# Patient Record
Sex: Male | Born: 1985 | Race: White | Hispanic: No | Marital: Single | Smoking: Current every day smoker
Health system: Southern US, Community
[De-identification: ages and names within clinical notes are randomized; demographics above are authoritative.]

## PROBLEM LIST (undated history)

## (undated) HISTORY — PX: CLAVICLE SURGERY: SHX598

---
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
## 2004-07-20 ENCOUNTER — Emergency Department (HOSPITAL_COMMUNITY): Admission: EM | Admit: 2004-07-20 | Discharge: 2004-07-20 | Payer: Self-pay | Admitting: Emergency Medicine

## 2008-12-15 ENCOUNTER — Emergency Department (HOSPITAL_BASED_OUTPATIENT_CLINIC_OR_DEPARTMENT_OTHER): Admission: EM | Admit: 2008-12-15 | Discharge: 2008-12-15 | Payer: Self-pay | Admitting: Emergency Medicine

## 2009-09-30 ENCOUNTER — Emergency Department (HOSPITAL_BASED_OUTPATIENT_CLINIC_OR_DEPARTMENT_OTHER): Admission: EM | Admit: 2009-09-30 | Discharge: 2009-10-01 | Payer: Self-pay | Admitting: Emergency Medicine

## 2009-10-01 ENCOUNTER — Ambulatory Visit: Payer: Self-pay | Admitting: Diagnostic Radiology

## 2010-04-08 ENCOUNTER — Emergency Department (HOSPITAL_COMMUNITY): Admission: EM | Admit: 2010-04-08 | Discharge: 2010-04-08 | Payer: Self-pay | Admitting: Emergency Medicine

## 2019-05-01 ENCOUNTER — Other Ambulatory Visit: Payer: Self-pay

## 2019-05-01 DIAGNOSIS — Z20822 Contact with and (suspected) exposure to covid-19: Secondary | ICD-10-CM

## 2019-05-03 LAB — NOVEL CORONAVIRUS, NAA: SARS-CoV-2, NAA: NOT DETECTED

## 2020-01-29 ENCOUNTER — Encounter (HOSPITAL_COMMUNITY): Payer: Self-pay | Admitting: *Deleted

## 2020-01-29 ENCOUNTER — Emergency Department (HOSPITAL_COMMUNITY): Payer: HRSA Program

## 2020-01-29 ENCOUNTER — Other Ambulatory Visit: Payer: Self-pay

## 2020-01-29 ENCOUNTER — Emergency Department (HOSPITAL_COMMUNITY)
Admission: EM | Admit: 2020-01-29 | Discharge: 2020-01-29 | Disposition: A | Discharge status: Home / Self Care | Payer: HRSA Program | Attending: Emergency Medicine | Admitting: Emergency Medicine

## 2020-01-29 DIAGNOSIS — U071 COVID-19: Secondary | ICD-10-CM

## 2020-01-29 DIAGNOSIS — Z87891 Personal history of nicotine dependence: Secondary | ICD-10-CM | POA: Diagnosis not present

## 2020-01-29 DIAGNOSIS — R05 Cough: Secondary | ICD-10-CM | POA: Diagnosis present

## 2020-01-29 LAB — SARS CORONAVIRUS 2 BY RT PCR (HOSPITAL ORDER, PERFORMED IN ~~LOC~~ HOSPITAL LAB): SARS Coronavirus 2: POSITIVE — AB

## 2020-01-29 MED ORDER — ALBUTEROL SULFATE HFA 108 (90 BASE) MCG/ACT IN AERS
2.0000 | INHALATION_SPRAY | Freq: Once | RESPIRATORY_TRACT | Status: AC
  Administered 2020-01-29: 2 via RESPIRATORY_TRACT
  Filled 2020-01-29: qty 6.7

## 2020-01-29 MED ORDER — ACETAMINOPHEN 500 MG PO TABS
1000.0000 mg | ORAL_TABLET | Freq: Once | ORAL | Status: AC
  Administered 2020-01-29: 1000 mg via ORAL
  Filled 2020-01-29: qty 2

## 2020-01-29 NOTE — Discharge Instructions (Signed)
You have tested positive for COVID-19 virus.  Please continue to quarantine at home and monitor your symptoms closely. You chest x-ray showed some signs of early Covid pneumonia. Antibiotics are not helpful in treating viral infection, the virus should run its course in about 10-14 days. Please make sure you are drinking plenty of fluids. You can treat your symptoms supportively with tylenol at 1000 mg and ibuprofen 600 mg every 6 hours for fevers and pains, and over the counter cough syrups and throat lozenges to help with cough.  You can use albuterol inhaler to help with shortness of breath and cough.  If your symptoms are not improving please follow up with you Primary doctor.   I recommend that you purchase a home pulse ox to help better monitor your oxygen at home, if you start to have increased work of breathing or shortness of breath or your oxygen drops below 92% please immediately return to the hospital for reevaluation.  If you develop persistent fevers, shortness of breath or difficulty breathing, chest pain, severe headache and neck pain, persistent nausea and vomiting or other new or concerning symptoms return to the Emergency department.

## 2020-01-29 NOTE — ED Notes (Signed)
pulse ox while ambulating p 107  Pulse ox 97 per cent RA
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
## 2020-01-29 NOTE — ED Triage Notes (Signed)
Pt c/o generalized body aches and weakness; pt states he has green drainage in his nose; pt c/o sob

## 2020-01-30 ENCOUNTER — Other Ambulatory Visit (HOSPITAL_COMMUNITY): Payer: Self-pay | Admitting: Nurse Practitioner

## 2020-01-30 ENCOUNTER — Telehealth (HOSPITAL_COMMUNITY): Payer: Self-pay | Admitting: Nurse Practitioner

## 2020-01-30 DIAGNOSIS — U071 COVID-19: Secondary | ICD-10-CM

## 2020-01-30 DIAGNOSIS — Z6830 Body mass index (BMI) 30.0-30.9, adult: Secondary | ICD-10-CM

## 2020-01-30 NOTE — Telephone Encounter (Signed)
Called patient to review monoclonal antibody treatment for covid 19 given to patients who are at risk for complications and/or hospitalization from the virus.   Patient meets at risk criteria due to: symptomatic, BMI>25.  After discussion, the patient wished to proceed with therapy and was scheduled for 9/7 at 0830am.  Possible side effects and infusion related reaction information was discussed. Further information sent to patient via MyChart.   Ocie Bob, AGNP-C

## 2020-01-30 NOTE — Progress Notes (Signed)
I connected by phone with Sean Soto on 01/30/2020 at 6:22 PM to discuss the potential use of a new treatment for mild to moderate COVID-19 viral infection in non-hospitalized patients.  This patient is a 34 y.o. male that meets the FDA criteria for Emergency Use Authorization of COVID monoclonal antibody casirivimab/imdevimab.  Has a (+) direct SARS-CoV-2 viral test result  Has mild or moderate COVID-19   Is NOT hospitalized due to COVID-19  Is within 10 days of symptom onset  Has at least one of the high risk factor(s) for progression to severe COVID-19 and/or hospitalization as defined in EUA.  Specific high risk criteria : BMI > 25   I have spoken and communicated the following to the patient or parent/caregiver regarding COVID monoclonal antibody treatment:  1. FDA has authorized the emergency use for the treatment of mild to moderate COVID-19 in adults and pediatric patients with positive results of direct SARS-CoV-2 viral testing who are 34 years of age and older weighing at least 40 kg, and who are at high risk for progressing to severe COVID-19 and/or hospitalization.  2. The significant known and potential risks and benefits of COVID monoclonal antibody, and the extent to which such potential risks and benefits are unknown.  3. Information on available alternative treatments and the risks and benefits of those alternatives, including clinical trials.  4. Patients treated with COVID monoclonal antibody should continue to self-isolate and use infection control measures (e.g., wear mask, isolate, social distance, avoid sharing personal items, clean and disinfect "high touch" surfaces, and frequent handwashing) according to CDC guidelines.   5. The patient or parent/caregiver has the option to accept or refuse COVID monoclonal antibody treatment.  After reviewing this information with the patient, The patient agreed to proceed with receiving casirivimab\imdevimab infusion and  will be provided a copy of the Fact sheet prior to receiving the infusion. Sean Soto 01/30/2020 6:22 PM

## 2020-01-31 ENCOUNTER — Ambulatory Visit (HOSPITAL_COMMUNITY)
Admission: RE | Admit: 2020-01-31 | Discharge: 2020-01-31 | Disposition: A | Discharge status: Home / Self Care | Payer: HRSA Program | Source: Ambulatory Visit | Attending: Pulmonary Disease | Admitting: Pulmonary Disease

## 2020-01-31 DIAGNOSIS — U071 COVID-19: Secondary | ICD-10-CM | POA: Diagnosis not present

## 2020-01-31 DIAGNOSIS — Z6830 Body mass index (BMI) 30.0-30.9, adult: Secondary | ICD-10-CM

## 2020-01-31 MED ORDER — ALBUTEROL SULFATE HFA 108 (90 BASE) MCG/ACT IN AERS: 2.0000 | INHALATION_SPRAY | Freq: Once | RESPIRATORY_TRACT | Status: DC | PRN

## 2020-01-31 MED ORDER — FAMOTIDINE IN NACL 20-0.9 MG/50ML-% IV SOLN: 20.0000 mg | Freq: Once | INTRAVENOUS | Status: DC | PRN

## 2020-01-31 MED ORDER — SODIUM CHLORIDE 0.9 % IV SOLN
INTRAVENOUS | Status: DC | PRN
  Administered 2020-01-31: 1000 mL via INTRAVENOUS

## 2020-01-31 MED ORDER — EPINEPHRINE 0.3 MG/0.3ML IJ SOAJ: 0.3000 mg | Freq: Once | INTRAMUSCULAR | Status: DC | PRN

## 2020-01-31 MED ORDER — SODIUM CHLORIDE 0.9 % IV SOLN
1200.0000 mg | Freq: Once | INTRAVENOUS | Status: AC
  Administered 2020-01-31: 1200 mg via INTRAVENOUS
  Filled 2020-01-31: qty 10

## 2020-01-31 MED ORDER — DIPHENHYDRAMINE HCL 50 MG/ML IJ SOLN: 50.0000 mg | Freq: Once | INTRAMUSCULAR | Status: DC | PRN

## 2020-01-31 MED ORDER — METHYLPREDNISOLONE SODIUM SUCC 125 MG IJ SOLR: 125.0000 mg | Freq: Once | INTRAMUSCULAR | Status: DC | PRN

## 2020-01-31 NOTE — Discharge Instructions (Signed)

## 2020-01-31 NOTE — Progress Notes (Signed)
  Diagnosis: COVID-19  Physician: Dr Wright  Procedure: Cov[facility] Infusion Clinic Med: casirivimab\imdevimab infusion - Provided patient with casirivimab\imdevimab fact sheet for patients, parents and caregivers prior to infusion.  Complications: No immediate complications noted.  Discharge: Discharged home   Sean Soto Jane 01/31/2020   

## 2021-03-15 ENCOUNTER — Encounter (HOSPITAL_COMMUNITY): Payer: Self-pay | Admitting: Surgery

## 2021-03-15 ENCOUNTER — Emergency Department (HOSPITAL_COMMUNITY): Payer: Self-pay

## 2021-03-15 ENCOUNTER — Inpatient Hospital Stay (HOSPITAL_COMMUNITY)
Admission: EM | Admit: 2021-03-15 | Discharge: 2021-03-21 | DRG: 459 | Disposition: A | Discharge status: Home / Self Care | Payer: Self-pay | Attending: Surgery | Admitting: Surgery

## 2021-03-15 DIAGNOSIS — S22051A Stable burst fracture of T5-T6 vertebra, initial encounter for closed fracture: Secondary | ICD-10-CM | POA: Diagnosis present

## 2021-03-15 DIAGNOSIS — J939 Pneumothorax, unspecified: Secondary | ICD-10-CM

## 2021-03-15 DIAGNOSIS — S270XXA Traumatic pneumothorax, initial encounter: Secondary | ICD-10-CM | POA: Diagnosis present

## 2021-03-15 DIAGNOSIS — S22009A Unspecified fracture of unspecified thoracic vertebra, initial encounter for closed fracture: Secondary | ICD-10-CM | POA: Diagnosis present

## 2021-03-15 DIAGNOSIS — T1490XA Injury, unspecified, initial encounter: Secondary | ICD-10-CM

## 2021-03-15 DIAGNOSIS — S22081A Stable burst fracture of T11-T12 vertebra, initial encounter for closed fracture: Principal | ICD-10-CM | POA: Diagnosis present

## 2021-03-15 DIAGNOSIS — S22001A Stable burst fracture of unspecified thoracic vertebra, initial encounter for closed fracture: Secondary | ICD-10-CM

## 2021-03-15 DIAGNOSIS — Y92410 Unspecified street and highway as the place of occurrence of the external cause: Secondary | ICD-10-CM

## 2021-03-15 DIAGNOSIS — R52 Pain, unspecified: Secondary | ICD-10-CM

## 2021-03-15 DIAGNOSIS — G911 Obstructive hydrocephalus: Secondary | ICD-10-CM | POA: Diagnosis present

## 2021-03-15 DIAGNOSIS — S42294A Other nondisplaced fracture of upper end of right humerus, initial encounter for closed fracture: Secondary | ICD-10-CM | POA: Diagnosis present

## 2021-03-15 DIAGNOSIS — S22019A Unspecified fracture of first thoracic vertebra, initial encounter for closed fracture: Secondary | ICD-10-CM | POA: Diagnosis present

## 2021-03-15 DIAGNOSIS — Z20822 Contact with and (suspected) exposure to covid-19: Secondary | ICD-10-CM | POA: Diagnosis present

## 2021-03-15 DIAGNOSIS — J9690 Respiratory failure, unspecified, unspecified whether with hypoxia or hypercapnia: Secondary | ICD-10-CM | POA: Diagnosis present

## 2021-03-15 DIAGNOSIS — J811 Chronic pulmonary edema: Secondary | ICD-10-CM | POA: Diagnosis present

## 2021-03-15 DIAGNOSIS — Z419 Encounter for procedure for purposes other than remedying health state, unspecified: Secondary | ICD-10-CM

## 2021-03-15 DIAGNOSIS — M4854XA Collapsed vertebra, not elsewhere classified, thoracic region, initial encounter for fracture: Secondary | ICD-10-CM | POA: Diagnosis present

## 2021-03-15 DIAGNOSIS — Z888 Allergy status to other drugs, medicaments and biological substances status: Secondary | ICD-10-CM

## 2021-03-15 DIAGNOSIS — S32011A Stable burst fracture of first lumbar vertebra, initial encounter for closed fracture: Secondary | ICD-10-CM | POA: Diagnosis present

## 2021-03-15 DIAGNOSIS — S2231XA Fracture of one rib, right side, initial encounter for closed fracture: Secondary | ICD-10-CM | POA: Diagnosis present

## 2021-03-15 DIAGNOSIS — F17210 Nicotine dependence, cigarettes, uncomplicated: Secondary | ICD-10-CM | POA: Diagnosis present

## 2021-03-15 DIAGNOSIS — Z885 Allergy status to narcotic agent status: Secondary | ICD-10-CM

## 2021-03-15 LAB — CBC
HCT: 47.1 % (ref 39.0–52.0)
Hemoglobin: 15.5 g/dL (ref 13.0–17.0)
MCH: 29.2 pg (ref 26.0–34.0)
MCHC: 32.9 g/dL (ref 30.0–36.0)
MCV: 88.9 fL (ref 80.0–100.0)
Platelets: 287 10*3/uL (ref 150–400)
RBC: 5.3 MIL/uL (ref 4.22–5.81)
RDW: 12.7 % (ref 11.5–15.5)
WBC: 9.4 10*3/uL (ref 4.0–10.5)
nRBC: 0 % (ref 0.0–0.2)

## 2021-03-15 LAB — COMPREHENSIVE METABOLIC PANEL
ALT: 37 U/L (ref 0–44)
AST: 35 U/L (ref 15–41)
Albumin: 3.9 g/dL (ref 3.5–5.0)
Alkaline Phosphatase: 46 U/L (ref 38–126)
Anion gap: 11 (ref 5–15)
BUN: 11 mg/dL (ref 6–20)
CO2: 18 mmol/L — ABNORMAL LOW (ref 22–32)
Calcium: 8.7 mg/dL — ABNORMAL LOW (ref 8.9–10.3)
Chloride: 104 mmol/L (ref 98–111)
Creatinine, Ser: 0.94 mg/dL (ref 0.61–1.24)
GFR, Estimated: >60 mL/min (ref >60)
Glucose, Bld: 117 mg/dL — ABNORMAL HIGH (ref 70–99)
Potassium: 3.1 mmol/L — ABNORMAL LOW (ref 3.5–5.1)
Sodium: 133 mmol/L — ABNORMAL LOW (ref 135–145)
Total Bilirubin: 0.7 mg/dL (ref 0.3–1.2)
Total Protein: 6.7 g/dL (ref 6.5–8.1)

## 2021-03-15 LAB — LACTIC ACID, PLASMA: Lactic Acid, Venous: 3.2 mmol/L (ref 0.5–1.9)

## 2021-03-15 LAB — URINALYSIS, ROUTINE W REFLEX MICROSCOPIC
Bilirubin Urine: NEGATIVE
Glucose, UA: NEGATIVE mg/dL
Hgb urine dipstick: NEGATIVE
Ketones, ur: NEGATIVE mg/dL
Leukocytes,Ua: NEGATIVE
Nitrite: NEGATIVE
Protein, ur: NEGATIVE mg/dL
Specific Gravity, Urine: >1.046 — ABNORMAL HIGH (ref 1.005–1.030)
pH: 5 (ref 5.0–8.0)

## 2021-03-15 LAB — ETHANOL: Alcohol, Ethyl (B): 82 mg/dL — ABNORMAL HIGH (ref <10)

## 2021-03-15 LAB — RESP PANEL BY RT-PCR (FLU A&B, COVID) ARPGX2
Influenza A by PCR: NEGATIVE
Influenza B by PCR: NEGATIVE
SARS Coronavirus 2 by RT PCR: NEGATIVE

## 2021-03-15 LAB — PROTIME-INR
INR: 1.1 (ref 0.8–1.2)
Prothrombin Time: 14.3 seconds (ref 11.4–15.2)

## 2021-03-15 MED ORDER — ONDANSETRON HCL 4 MG/2ML IJ SOLN
4.0000 mg | Freq: Once | INTRAMUSCULAR | Status: AC
  Administered 2021-03-15: 4 mg via INTRAVENOUS
  Filled 2021-03-15: qty 2

## 2021-03-15 MED ORDER — LACTATED RINGERS IV BOLUS
1000.0000 mL | Freq: Once | INTRAVENOUS | Status: AC
  Administered 2021-03-15: 1000 mL via INTRAVENOUS

## 2021-03-15 MED ORDER — MORPHINE SULFATE (PF) 4 MG/ML IV SOLN
4.0000 mg | Freq: Once | INTRAVENOUS | Status: AC
  Administered 2021-03-15: 4 mg via INTRAVENOUS
  Filled 2021-03-15: qty 1

## 2021-03-15 MED ORDER — HYDROMORPHONE HCL 1 MG/ML IJ SOLN
1.0000 mg | Freq: Once | INTRAMUSCULAR | Status: AC
  Administered 2021-03-15: 1 mg via INTRAVENOUS
  Filled 2021-03-15: qty 1

## 2021-03-15 MED ORDER — IOHEXOL 300 MG/ML  SOLN
100.0000 mL | Freq: Once | INTRAMUSCULAR | Status: AC | PRN
  Administered 2021-03-15: 100 mL via INTRAVENOUS

## 2021-03-15 NOTE — ED Notes (Signed)
Pt continues to refuse type and cross lab draw at this time
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
## 2021-03-15 NOTE — Progress Notes (Signed)
Orthopedic Tech Progress Note Patient Details:  Sean Soto 02/28/1986 [id number]  Ortho Devices Type of Ortho Device: Other (comment) Ortho Device/Splint Location: TLSO Ortho Device/Splint Interventions: Ordered, Application, Adjustment  I applied TLSO to patient with ED Tech help. Post Interventions Patient Tolerated: Well Instructions Provided: Care of device, Adjustment of device  Trinna Post 03/15/2021, 11:00 PM
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
## 2021-03-15 NOTE — ED Notes (Signed)
Ortho tech at bedside to apply TLSO
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
## 2021-03-15 NOTE — ED Notes (Signed)
Pt to CT with trauma RN.

## 2021-03-15 NOTE — ED Notes (Signed)
Pt is refusing type and screen blood work

## 2021-03-16 ENCOUNTER — Other Ambulatory Visit: Payer: Self-pay

## 2021-03-16 ENCOUNTER — Inpatient Hospital Stay (HOSPITAL_COMMUNITY): Payer: Self-pay

## 2021-03-16 DIAGNOSIS — S42294A Other nondisplaced fracture of upper end of right humerus, initial encounter for closed fracture: Secondary | ICD-10-CM

## 2021-03-16 DIAGNOSIS — S42201A Unspecified fracture of upper end of right humerus, initial encounter for closed fracture: Secondary | ICD-10-CM | POA: Insufficient documentation

## 2021-03-16 DIAGNOSIS — S22009A Unspecified fracture of unspecified thoracic vertebra, initial encounter for closed fracture: Secondary | ICD-10-CM | POA: Diagnosis present

## 2021-03-16 LAB — I-STAT CHEM 8, ED
BUN: 10 mg/dL (ref 6–20)
Calcium, Ion: 1.01 mmol/L — ABNORMAL LOW (ref 1.15–1.40)
Chloride: 105 mmol/L (ref 98–111)
Creatinine, Ser: 1 mg/dL (ref 0.61–1.24)
Glucose, Bld: 118 mg/dL — ABNORMAL HIGH (ref 70–99)
HCT: 46 % (ref 39.0–52.0)
Hemoglobin: 15.6 g/dL (ref 13.0–17.0)
Potassium: 3.1 mmol/L — ABNORMAL LOW (ref 3.5–5.1)
Sodium: 138 mmol/L (ref 135–145)
TCO2: 20 mmol/L — ABNORMAL LOW (ref 22–32)

## 2021-03-16 MED ORDER — METHOCARBAMOL 500 MG PO TABS
1000.0000 mg | ORAL_TABLET | Freq: Three times a day (TID) | ORAL | Status: DC
  Administered 2021-03-16 – 2021-03-21 (×12): 1000 mg via ORAL
  Filled 2021-03-16 (×12): qty 2

## 2021-03-16 MED ORDER — ONDANSETRON 4 MG PO TBDP: 4.0000 mg | ORAL_TABLET | Freq: Four times a day (QID) | ORAL | Status: DC | PRN

## 2021-03-16 MED ORDER — ONDANSETRON HCL 4 MG/2ML IJ SOLN
4.0000 mg | Freq: Four times a day (QID) | INTRAMUSCULAR | Status: DC | PRN
  Administered 2021-03-16: 4 mg via INTRAVENOUS
  Filled 2021-03-16: qty 2

## 2021-03-16 MED ORDER — MORPHINE SULFATE (PF) 4 MG/ML IV SOLN
4.0000 mg | INTRAVENOUS | Status: DC | PRN
  Administered 2021-03-16 – 2021-03-17 (×8): 4 mg via INTRAVENOUS
  Filled 2021-03-16 (×8): qty 1

## 2021-03-16 MED ORDER — GABAPENTIN 600 MG PO TABS
300.0000 mg | ORAL_TABLET | Freq: Two times a day (BID) | ORAL | Status: DC
  Administered 2021-03-16 – 2021-03-17 (×3): 300 mg via ORAL
  Filled 2021-03-16: qty 1
  Filled 2021-03-16: qty 0.5
  Filled 2021-03-16: qty 1
  Filled 2021-03-16: qty 0.5

## 2021-03-16 MED ORDER — ACETAMINOPHEN 500 MG PO TABS
1000.0000 mg | ORAL_TABLET | Freq: Four times a day (QID) | ORAL | Status: DC
  Administered 2021-03-16 – 2021-03-21 (×14): 1000 mg via ORAL
  Filled 2021-03-16 (×15): qty 2

## 2021-03-16 MED ORDER — DOCUSATE SODIUM 100 MG PO CAPS
100.0000 mg | ORAL_CAPSULE | Freq: Two times a day (BID) | ORAL | Status: DC
  Administered 2021-03-16 – 2021-03-17 (×4): 100 mg via ORAL
  Filled 2021-03-16 (×4): qty 1

## 2021-03-16 MED ORDER — LACTATED RINGERS IV SOLN: INTRAVENOUS | Status: DC

## 2021-03-16 MED ORDER — OXYCODONE HCL 5 MG PO TABS
5.0000 mg | ORAL_TABLET | ORAL | Status: DC | PRN
Start: 2021-03-16 — End: 2021-03-17
  Administered 2021-03-16 (×4): 10 mg via ORAL
  Administered 2021-03-16: 5 mg via ORAL
  Administered 2021-03-17 (×3): 10 mg via ORAL
  Filled 2021-03-16 (×2): qty 2
  Filled 2021-03-16: qty 1
  Filled 2021-03-16: qty 2
  Filled 2021-03-16: qty 1
  Filled 2021-03-16 (×4): qty 2

## 2021-03-16 NOTE — ED Notes (Signed)
Patient transported to MRI 

## 2021-03-16 NOTE — ED Notes (Signed)
Pt unable to complete MRI due to TSLO brace.
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
## 2021-03-16 NOTE — Progress Notes (Signed)
Patient ID: Sean Soto, male   DOB: [birth year], 36 y.o.   MRN: [id number] I came to see the patient but he is currently in the MRI scanner.  I did review the CT scans thoroughly to assess the right proximal humerus fracture.  There are small nondisplaced fracture lines through the posterior aspect of the greater tuberosity at the humeral head area.  This is a very stable fracture pattern and is quite minimal.  My recommendation will be considering a sling but mainly no shoulder abduction and external rotation for the short-term.  I will still need to evaluate him clinically to get a better idea what type of pain he may have with this.  Fortunately there is not an indication for surgery being that this is such a very small area and there is no displacement seen.  I will make sure that there is orthopedic follow-up.

## 2021-03-16 NOTE — ED Notes (Addendum)
Patient refused am labs notifed RN [facility]

## 2021-03-17 ENCOUNTER — Other Ambulatory Visit: Payer: Self-pay | Admitting: Neurosurgery

## 2021-03-17 ENCOUNTER — Inpatient Hospital Stay (HOSPITAL_COMMUNITY): Payer: Self-pay

## 2021-03-17 LAB — CBC
HCT: 43.4 % (ref 39.0–52.0)
Hemoglobin: 14.5 g/dL (ref 13.0–17.0)
MCH: 29.5 pg (ref 26.0–34.0)
MCHC: 33.4 g/dL (ref 30.0–36.0)
MCV: 88.2 fL (ref 80.0–100.0)
Platelets: 170 10*3/uL (ref 150–400)
RBC: 4.92 MIL/uL (ref 4.22–5.81)
RDW: 12.9 % (ref 11.5–15.5)
WBC: 7.9 10*3/uL (ref 4.0–10.5)
nRBC: 0 % (ref 0.0–0.2)

## 2021-03-17 LAB — BASIC METABOLIC PANEL
Anion gap: 7 (ref 5–15)
BUN: 5 mg/dL — ABNORMAL LOW (ref 6–20)
CO2: 27 mmol/L (ref 22–32)
Calcium: 8.7 mg/dL — ABNORMAL LOW (ref 8.9–10.3)
Chloride: 101 mmol/L (ref 98–111)
Creatinine, Ser: 0.79 mg/dL (ref 0.61–1.24)
GFR, Estimated: >60 mL/min (ref >60)
Glucose, Bld: 103 mg/dL — ABNORMAL HIGH (ref 70–99)
Potassium: 4.1 mmol/L (ref 3.5–5.1)
Sodium: 135 mmol/L (ref 135–145)

## 2021-03-17 MED ORDER — POLYETHYLENE GLYCOL 3350 17 G PO PACK
17.0000 g | PACK | Freq: Every day | ORAL | Status: DC
  Administered 2021-03-17: 17 g via ORAL
  Filled 2021-03-17: qty 1

## 2021-03-17 MED ORDER — ENOXAPARIN SODIUM 30 MG/0.3ML IJ SOSY
30.0000 mg | PREFILLED_SYRINGE | Freq: Two times a day (BID) | INTRAMUSCULAR | Status: DC
  Administered 2021-03-18: 30 mg via SUBCUTANEOUS
  Filled 2021-03-17 (×2): qty 0.3

## 2021-03-17 MED ORDER — GABAPENTIN 300 MG PO CAPS
300.0000 mg | ORAL_CAPSULE | Freq: Three times a day (TID) | ORAL | Status: DC
  Administered 2021-03-17 – 2021-03-21 (×7): 300 mg via ORAL
  Filled 2021-03-17 (×7): qty 1

## 2021-03-17 MED ORDER — KETOROLAC TROMETHAMINE 15 MG/ML IJ SOLN
30.0000 mg | Freq: Four times a day (QID) | INTRAMUSCULAR | Status: DC
Start: 2021-03-17 — End: 2021-03-21
  Administered 2021-03-17 – 2021-03-21 (×11): 30 mg via INTRAVENOUS
  Filled 2021-03-17 (×11): qty 2

## 2021-03-17 MED ORDER — GABAPENTIN 600 MG PO TABS: 300.0000 mg | ORAL_TABLET | Freq: Three times a day (TID) | ORAL | Status: DC

## 2021-03-17 MED ORDER — OXYCODONE HCL 5 MG PO TABS
10.0000 mg | ORAL_TABLET | ORAL | Status: DC | PRN
  Administered 2021-03-17 – 2021-03-18 (×4): 15 mg via ORAL
  Administered 2021-03-20 (×2): 10 mg via ORAL
  Administered 2021-03-21: 15 mg via ORAL
  Administered 2021-03-21: 10 mg via ORAL
  Administered 2021-03-21: 15 mg via ORAL
  Filled 2021-03-17 (×2): qty 3
  Filled 2021-03-17: qty 2
  Filled 2021-03-17 (×3): qty 3
  Filled 2021-03-17: qty 2
  Filled 2021-03-17 (×2): qty 3

## 2021-03-17 MED ORDER — ALUM & MAG HYDROXIDE-SIMETH 200-200-20 MG/5ML PO SUSP
30.0000 mL | Freq: Four times a day (QID) | ORAL | Status: DC | PRN
  Administered 2021-03-17 – 2021-03-21 (×3): 30 mL via ORAL
  Filled 2021-03-17 (×3): qty 30

## 2021-03-17 MED ORDER — HYDROMORPHONE HCL 1 MG/ML IJ SOLN
0.5000 mg | INTRAMUSCULAR | Status: DC | PRN
  Administered 2021-03-17 – 2021-03-21 (×10): 1 mg via INTRAVENOUS
  Filled 2021-03-17 (×10): qty 1

## 2021-03-17 MED ORDER — DIPHENHYDRAMINE HCL 25 MG PO CAPS
25.0000 mg | ORAL_CAPSULE | Freq: Four times a day (QID) | ORAL | Status: DC | PRN
  Administered 2021-03-17 (×2): 25 mg via ORAL
  Filled 2021-03-17 (×2): qty 1

## 2021-03-17 MED ORDER — HYDROMORPHONE HCL 1 MG/ML IJ SOLN
0.5000 mg | INTRAMUSCULAR | Status: DC | PRN
Start: 2021-03-17 — End: 2021-03-17

## 2021-03-17 NOTE — Progress Notes (Signed)
Orthopedic Tech Progress Note Patient Details:  CAIDE CAMPI [birth year] [id number]  Ortho Devices Type of Ortho Device: Sling immobilizer Ortho Device/Splint Location: RUE Ortho Device/Splint Interventions: Ordered   Post Interventions Patient Tolerated: Well Instructions Provided: Care of device  Donald Pore 03/17/2021, 1:21 PM

## 2021-03-17 NOTE — Plan of Care (Signed)

## 2021-03-17 NOTE — Progress Notes (Signed)
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 contusions developing. No pulmonary nodule. No pulmonary mass. No pulmonary laceration. No definite pneumatocele formation. The central airways are patent. Pleura: No pleural effusion. Tiny trace right apical pneumothorax. A tiny trace left apical pneumothorax is not fully excluded. No hemothorax. Lymph Nodes: No mediastinal, hilar, or axillary lymphadenopathy. Mediastinum: No pneumomediastinum. No aortic injury or mediastinal hematoma. The thoracic aorta is normal in caliber. The heart is normal in size. No significant pericardial effusion. The  esophagus is unremarkable.  Small hiatal hernia. The thyroid is unremarkable. Chest Wall / Breasts: No chest wall mass. Musculoskeletal: Acute displaced right posterior fifth rib fracture. No acute sternal fracture. Nondisplaced right humeral head fracture. CT ABDOMEN AND PELVIS FINDINGS Liver: Not enlarged. No focal lesion. No laceration or subcapsular hematoma. Biliary System: The gallbladder is otherwise unremarkable with no radio-opaque gallstones. No biliary ductal dilatation. Pancreas: Normal pancreatic contour. No  main pancreatic duct dilatation. Spleen: Not enlarged. No focal lesion. No laceration, subcapsular hematoma, or vascular injury. Adrenal Glands: No nodularity bilaterally. Kidneys: Bilateral kidneys enhance symmetrically. No hydronephrosis. No contusion, laceration, or subcapsular hematoma. No injury to the vascular structures or collecting systems. No hydroureter. The urinary bladder is unremarkable. On delayed imaging, there is no urothelial wall thickening and there are no filling defects in the opacified portions of the bilateral collecting systems or ureters. Bowel: No small or large bowel wall thickening or dilatation. The appendix is unremarkable. Mesentery, Omentum, and Peritoneum: No simple free fluid ascites. No pneumoperitoneum. No hemoperitoneum. No mesenteric hematoma identified. No organized fluid collection. Pelvic Organs: Normal. Lymph Nodes: No abdominal, pelvic, inguinal lymphadenopathy. Vasculature: Mild atherosclerotic plaque. No abdominal aorta or iliac aneurysm. No active contrast extravasation or pseudoaneurysm. Musculoskeletal: No significant soft tissue hematoma. No acute pelvic fracture. CT THORACIC SPINE FINDINGS Alignment: Normal. Vertebrae T1 displaced spinous process fracture. T4 compression fracture involving the superior endplate with less than 5% height loss. T5 incomplete burst fracture with greater than 45% height loss. No associated retropulsion. T5 fracture  extends posteriorly to involve the right superior articular facet (11:60). Likely associated T5 pedicle fracture on the left. T12 burst fracture with 7 mm retropulsion into the central canal and at least 30% vertebral body height loss. Associated at least mild to moderate central canal stenosis. Paraspinal and other soft tissues: Subcutaneus soft tissue hematoma formation and fat stranding surrounding the T1 spinous process. Paravertebral hematoma formation along the T3 through T5 levels. CT LUMBAR SPINE FINDINGS Segmentation: 5 lumbar type vertebrae. Alignment: Normal. Vertebrae: Question nondisplaced L1 right transverse process fracture. No focal pathologic process. Paraspinal and other soft tissues: Negative. IMPRESSION: 1. 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 Recommend MRI with and without contrast for further evaluation. 2. No acute displaced fracture or traumatic listhesis of the cervical spine. 3. Bilateral posterior lung subsegmental atelectasis and possible pulmonary contusions developing. 4. Tiny trace right apical pneumothorax. A tiny trace left apical pneumothorax is not fully excluded. 5. Acute displaced right posterior fifth rib fracture. 6. T1 displaced spinous process fracture. 7. T4 mild compression fracture. 8. T5 incomplete burst fracture with associated left pedicle and right superior articular facet fracture. No associated retropulsion into the central canal. Greater than 45% vertebral body height loss. 9. T12 burst fracture with 7 mm retropulsion into the central canal and at least 30% vertebral body height loss. Associated at least mild to moderate central canal stenosis. Recommend MRI for further evaluation. 10. Paravertebral hematoma formation along the T3 - T5 levels. 11. Question nondisplaced L1 right  transverse process fracture. 12. Nondisplaced right humeral head fracture. 13.  Aortic Atherosclerosis (ICD10-I70.0). These results were called by telephone at the time of interpretation on 03/15/2021 at 8:26 pm to provider DAVID YAO , who verbally acknowledged these results. Electronically Signed   By: Tish Frederickson M.D.   On: 03/15/2021 20:47   DG Chest Port 1 View  Result Date: 03/15/2021 CLINICAL DATA:  Level 2 trauma. EXAM: PORTABLE CHEST 1 VIEW COMPARISON:  Chest radiograph dated 01/29/2020. FINDINGS: No focal consolidation, pleural effusion, or pneumothorax. The cardiac silhouette is within normal limits. No acute osseous pathology. IMPRESSION: No active disease. Electronically Signed   By: Elgie Collard M.D.   On: 03/15/2021 19:54   DG Tibia/Fibula Left Port  Result Date:  03/16/2021 CLINICAL DATA:  Status post trauma. EXAM: PORTABLE LEFT TIBIA AND FIBULA - 2 VIEW COMPARISON:  None. FINDINGS: There is no evidence of acute fracture or dislocation. No focal bone lesions are identified. Mild soft tissue swelling is seen along the medial aspect of the left calf and anterior aspect of the left ankle. IMPRESSION: 1. No acute fracture or dislocation. 2. Mild soft tissue swelling. Electronically Signed   By: Aram Candela M.D.   On: 03/16/2021 00:49   DG Humerus Right  Result Date: 03/15/2021 CLINICAL DATA:  Status post trauma. EXAM: RIGHT HUMERUS - 2+ VIEW COMPARISON:  None. FINDINGS: There is no evidence of fracture or other focal bone lesions. Soft tissues are unremarkable. IMPRESSION: Negative. Electronically Signed   By: Aram Candela M.D.   On: 03/15/2021 19:59    Anti-infectives: Anti-infectives (From admission, onward)    None      Assessment/Plan [facility] Multiple back fractures inclduing T1 SP fx, T5 superior endplace compression fx, T6 burst fx, L1 burst fx w/ partial effacement of the CSF surroudning the distal thoracic cord - Per NSGY, Dr. [name]. MRI obtained. Plan for  surgery 10/25. Brace. Bedrest. HOB okay to be elevated to 15 degrees.  L1 R TP fx - pain control R humeral head fx - Per Ortho, Dr. [name], non-op management, avoid abduction and external rotation. Sling for comfort. PT/OT. Follow up outpatient.  R occult PTX, question L occult PTX - repeat CXR this AM pending, IS/pulm toilet R 5th rib fx - IS/pulm toilet L lateral lower leg pain - Xray and CT negative for fracture. Per Ortho. They suspect at least irritation of the superficial peroneal nerve. ? If related to back fx's/canal stenosis. On gabapentin. Dr. [name] discussed with [facility]. Plan for stress films when can get out of bed. Currently on bedrest.  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 Outpatient follow up. Radiology recommending MRI with and without contrast for further evaluation. FEN - Reg diet. NPO at midnight. IVF DVT - SCDs, start Lovenox (okay per NSGY) Dispo - Bedrest. CXR. Labs. 4NP. OR AM with Ortho   LOS: 2 days    Jacinto Halim , [facility] 03/17/2021, 11:34 AM Please see Amion for pager number during day hours 7:00am-4:30pm

## 2021-03-17 NOTE — Progress Notes (Signed)
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 delayed imaging, there is no urothelial wall thickening and there are no filling defects in the opacified portions of the bilateral collecting systems or ureters. Bowel: No small or large bowel wall thickening or dilatation. The appendix is unremarkable. Mesentery, Omentum, and Peritoneum: No simple free fluid ascites. No pneumoperitoneum. No hemoperitoneum. No mesenteric hematoma identified. No organized fluid collection. Pelvic Organs: Normal. Lymph Nodes: No abdominal, pelvic, inguinal lymphadenopathy. Vasculature: Mild atherosclerotic plaque. No abdominal aorta or iliac aneurysm. No active contrast extravasation or  pseudoaneurysm. Musculoskeletal: No significant soft tissue hematoma. No acute pelvic fracture. CT THORACIC SPINE FINDINGS Alignment: Normal. Vertebrae T1 displaced spinous process fracture. T4 compression fracture involving the superior endplate with less than 5% height loss. T5 incomplete burst fracture with greater than 45% height loss. No associated retropulsion. T5 fracture extends posteriorly to involve the right superior articular facet (11:60). Likely associated T5 pedicle fracture on the left. T12 burst fracture with 7 mm retropulsion into the central canal and at least 30% vertebral body height loss. Associated at least mild to moderate central canal stenosis. Paraspinal and other soft tissues: Subcutaneus soft tissue hematoma formation and fat stranding surrounding the T1 spinous process. Paravertebral hematoma formation along the T3 through T5 levels. CT LUMBAR SPINE FINDINGS Segmentation: 5 lumbar type vertebrae. Alignment: Normal. Vertebrae: Question nondisplaced L1 right transverse process fracture. No focal pathologic process. Paraspinal and other soft tissues: Negative. IMPRESSION: 1. 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 Recommend MRI with and without contrast for further evaluation. 2. No acute displaced fracture or traumatic listhesis of the cervical spine. 3. Bilateral posterior lung subsegmental atelectasis and possible pulmonary contusions developing. 4. Tiny trace right apical pneumothorax. A tiny trace left apical pneumothorax is not fully excluded. 5. Acute displaced right posterior fifth rib fracture. 6. T1 displaced spinous process fracture. 7. T4 mild compression fracture. 8. T5 incomplete burst fracture with associated left pedicle and right superior articular facet fracture. No associated retropulsion into  the central canal. Greater than 45% vertebral body height loss. 9. T12 burst fracture with 7 mm retropulsion into the central canal and at least 30% vertebral body height loss. Associated at least mild to moderate central canal stenosis. Recommend MRI for further evaluation. 10. Paravertebral hematoma formation along the T3 - T5 levels. 11. Question nondisplaced L1 right transverse process fracture. 12. Nondisplaced right humeral head fracture. 13.  Aortic Atherosclerosis (ICD10-I70.0). These results were called by telephone at the time of interpretation on 03/15/2021 at 8:26 pm to provider DAVID YAO , who verbally acknowledged these results. Electronically Signed   By: Tish Frederickson M.D.   On: 03/15/2021 20:47   DG CHEST PORT 1 VIEW  Result Date: 03/17/2021 CLINICAL DATA:  Pneumothorax EXAM: PORTABLE CHEST 1 VIEW COMPARISON:  Portable exam 1156 hours compared to 03/15/2021 FINDINGS: Normal heart size, mediastinal contours, and pulmonary vascularity. Bronchitic changes and bibasilar atelectasis. Lungs otherwise clear. No acute infiltrate, pleural effusion, or pneumothorax. Osseous structures unremarkable. No pulmonary infiltrate IMPRESSION: Bibasilar atelectasis. Electronically Signed   By: Ulyses Southward M.D.   On: 03/17/2021 14:56   DG Chest Port 1 View  Result Date: 03/15/2021 CLINICAL DATA:  Level 2 trauma. EXAM: PORTABLE CHEST 1 VIEW COMPARISON:  Chest radiograph dated 01/29/2020. FINDINGS: No focal consolidation, pleural effusion, or pneumothorax. The cardiac silhouette is within normal limits. No acute osseous pathology. IMPRESSION: No active disease. Electronically Signed   By: Elgie Collard M.D.   On: 03/15/2021 19:54   DG Tibia/Fibula Left Port  Result Date: 03/16/2021 CLINICAL DATA:  Status post trauma. EXAM: PORTABLE LEFT TIBIA AND FIBULA - 2 VIEW COMPARISON:  None. FINDINGS: There is no evidence of acute fracture or dislocation. No focal bone lesions are identified. Mild soft tissue  swelling is seen along the medial aspect of the left calf and anterior aspect of the left ankle. IMPRESSION: 1. No acute fracture or dislocation. 2. Mild soft tissue swelling. Electronically Signed   By: Aram Candela M.D.   On: 03/16/2021 00:49   DG Humerus Right  Result Date: 03/15/2021 CLINICAL DATA:  Status post trauma. EXAM: RIGHT HUMERUS - 2+ VIEW COMPARISON:  None. FINDINGS: There is no evidence of fracture or other focal bone lesions. Soft tissues are unremarkable. IMPRESSION: Negative. Electronically Signed   By: Aram Candela M.D.   On: 03/15/2021 19:59    Assessment/Plan: T6 and T13 burst fractures - plan for operative reduction and fixation tomorrow - risks, Benefits, alternatives, and expected convalescence were discussed with him and his wife.  Risks discussed included, but were not limited to, bleeding, pain, infection, scar, pseudoarthrosis, spinal fluid leak, neurologic deficit, damage to nearby organs, and death.  Informed consent was obtained. - he has a fairly significant sized colloid cyst with some ventriculomegaly but no transependymal flow or current symptoms related to this.  It can be further characterized with an MRI nonurgently   Sean Soto 03/17/2021, 5:27 PM

## 2021-03-18 ENCOUNTER — Inpatient Hospital Stay (HOSPITAL_COMMUNITY): Payer: Self-pay

## 2021-03-18 ENCOUNTER — Inpatient Hospital Stay (HOSPITAL_COMMUNITY): Payer: Self-pay | Admitting: Certified Registered Nurse Anesthetist

## 2021-03-18 ENCOUNTER — Inpatient Hospital Stay (HOSPITAL_COMMUNITY): Admission: EM | Disposition: A | Discharge status: Home / Self Care | Payer: Self-pay | Source: Home / Self Care

## 2021-03-18 ENCOUNTER — Encounter (HOSPITAL_COMMUNITY): Payer: Self-pay

## 2021-03-18 HISTORY — PX: LUMBAR PERCUTANEOUS PEDICLE SCREW 2 LEVEL: SHX5561

## 2021-03-18 HISTORY — PX: LAMINECTOMY WITH POSTERIOR LATERAL ARTHRODESIS LEVEL 4: SHX6338

## 2021-03-18 HISTORY — PX: APPLICATION OF ROBOTIC ASSISTANCE FOR SPINAL PROCEDURE: SHX6753

## 2021-03-18 LAB — BASIC METABOLIC PANEL
Anion gap: 5 (ref 5–15)
BUN: 6 mg/dL (ref 6–20)
CO2: 29 mmol/L (ref 22–32)
Calcium: 8.5 mg/dL — ABNORMAL LOW (ref 8.9–10.3)
Chloride: 102 mmol/L (ref 98–111)
Creatinine, Ser: 0.84 mg/dL (ref 0.61–1.24)
GFR, Estimated: >60 mL/min (ref >60)
Glucose, Bld: 130 mg/dL — ABNORMAL HIGH (ref 70–99)
Potassium: 4 mmol/L (ref 3.5–5.1)
Sodium: 136 mmol/L (ref 135–145)

## 2021-03-18 LAB — SURGICAL PCR SCREEN
MRSA, PCR: NEGATIVE
Staphylococcus aureus: POSITIVE — AB

## 2021-03-18 LAB — CBC
HCT: 42.4 % (ref 39.0–52.0)
Hemoglobin: 14 g/dL (ref 13.0–17.0)
MCH: 29.7 pg (ref 26.0–34.0)
MCHC: 33 g/dL (ref 30.0–36.0)
MCV: 90 fL (ref 80.0–100.0)
Platelets: 165 10*3/uL (ref 150–400)
RBC: 4.71 MIL/uL (ref 4.22–5.81)
RDW: 12.7 % (ref 11.5–15.5)
WBC: 7.9 10*3/uL (ref 4.0–10.5)
nRBC: 0 % (ref 0.0–0.2)

## 2021-03-18 LAB — TYPE AND SCREEN
ABO/RH(D): O POS
Antibody Screen: NEGATIVE

## 2021-03-18 LAB — ABO/RH: ABO/RH(D): O POS

## 2021-03-18 SURGERY — LAMINECTOMY WITH POSTERIOR LATERAL ARTHRODESIS LEVEL 4
Anesthesia: General | Site: Spine Thoracic

## 2021-03-18 MED ORDER — CHLORHEXIDINE GLUCONATE 0.12 % MT SOLN: 15.0000 mL | Freq: Once | OROMUCOSAL | Status: AC

## 2021-03-18 MED ORDER — MIDAZOLAM HCL 2 MG/2ML IJ SOLN
INTRAMUSCULAR | Status: AC
  Filled 2021-03-18: qty 2

## 2021-03-18 MED ORDER — METHOCARBAMOL 1000 MG/10ML IJ SOLN
500.0000 mg | Freq: Four times a day (QID) | INTRAVENOUS | Status: DC | PRN
  Administered 2021-03-19 (×3): 500 mg via INTRAVENOUS
  Filled 2021-03-18: qty 500
  Filled 2021-03-18: qty 5
  Filled 2021-03-18 (×2): qty 500

## 2021-03-18 MED ORDER — PROPOFOL 10 MG/ML IV BOLUS
INTRAVENOUS | Status: AC
  Filled 2021-03-18: qty 40

## 2021-03-18 MED ORDER — ALBUTEROL SULFATE HFA 108 (90 BASE) MCG/ACT IN AERS
INHALATION_SPRAY | RESPIRATORY_TRACT | Status: DC | PRN
  Administered 2021-03-18 (×2): 4 via RESPIRATORY_TRACT

## 2021-03-18 MED ORDER — HYDROMORPHONE HCL 1 MG/ML IJ SOLN
INTRAMUSCULAR | Status: AC
  Filled 2021-03-18: qty 0.5

## 2021-03-18 MED ORDER — PROMETHAZINE HCL 25 MG/ML IJ SOLN: 6.2500 mg | INTRAMUSCULAR | Status: DC | PRN

## 2021-03-18 MED ORDER — BUPIVACAINE HCL (PF) 0.5 % IJ SOLN
INTRAMUSCULAR | Status: DC | PRN
  Administered 2021-03-18: 20 mL

## 2021-03-18 MED ORDER — ACETAMINOPHEN 500 MG PO TABS: 1000.0000 mg | ORAL_TABLET | Freq: Once | ORAL | Status: DC

## 2021-03-18 MED ORDER — THROMBIN 5000 UNITS EX SOLR
CUTANEOUS | Status: AC
  Filled 2021-03-18: qty 5000

## 2021-03-18 MED ORDER — FENTANYL CITRATE (PF) 250 MCG/5ML IJ SOLN
INTRAMUSCULAR | Status: AC
  Filled 2021-03-18: qty 5

## 2021-03-18 MED ORDER — MEPERIDINE HCL 25 MG/ML IJ SOLN: 6.2500 mg | INTRAMUSCULAR | Status: DC | PRN

## 2021-03-18 MED ORDER — PROPOFOL 10 MG/ML IV BOLUS
INTRAVENOUS | Status: DC | PRN
  Administered 2021-03-18: 200 mg via INTRAVENOUS
  Administered 2021-03-18: 100 mg via INTRAVENOUS

## 2021-03-18 MED ORDER — SUCCINYLCHOLINE CHLORIDE 200 MG/10ML IV SOSY
PREFILLED_SYRINGE | INTRAVENOUS | Status: DC | PRN
  Administered 2021-03-18: 200 mg via INTRAVENOUS

## 2021-03-18 MED ORDER — FUROSEMIDE 10 MG/ML IJ SOLN
INTRAMUSCULAR | Status: DC | PRN
  Administered 2021-03-18: 20 mg via INTRAMUSCULAR

## 2021-03-18 MED ORDER — HYDROMORPHONE HCL 1 MG/ML IJ SOLN: 0.2500 mg | INTRAMUSCULAR | Status: DC | PRN

## 2021-03-18 MED ORDER — LIDOCAINE-EPINEPHRINE 1 %-1:100000 IJ SOLN
INTRAMUSCULAR | Status: DC | PRN
  Administered 2021-03-18: 10 mL

## 2021-03-18 MED ORDER — THROMBIN 5000 UNITS EX SOLR
OROMUCOSAL | Status: DC | PRN
  Administered 2021-03-18 (×3): 5 mL

## 2021-03-18 MED ORDER — DEXAMETHASONE SODIUM PHOSPHATE 10 MG/ML IJ SOLN
INTRAMUSCULAR | Status: DC | PRN
  Administered 2021-03-18: 10 mg via INTRAVENOUS

## 2021-03-18 MED ORDER — 0.9 % SODIUM CHLORIDE (POUR BTL) OPTIME
TOPICAL | Status: DC | PRN
  Administered 2021-03-18: 1000 mL

## 2021-03-18 MED ORDER — LIDOCAINE 2% (20 MG/ML) 5 ML SYRINGE
INTRAMUSCULAR | Status: DC | PRN
  Administered 2021-03-18: 40 mg via INTRAVENOUS

## 2021-03-18 MED ORDER — THROMBIN 5000 UNITS EX SOLR
CUTANEOUS | Status: AC
  Filled 2021-03-18: qty 10000

## 2021-03-18 MED ORDER — MIDAZOLAM HCL 2 MG/2ML IJ SOLN
INTRAMUSCULAR | Status: DC | PRN
  Administered 2021-03-18: 2 mg via INTRAVENOUS

## 2021-03-18 MED ORDER — PHENOL 1.4 % MT LIQD: 1.0000 | OROMUCOSAL | Status: DC | PRN

## 2021-03-18 MED ORDER — MUPIROCIN 2 % EX OINT
1.0000 "application " | TOPICAL_OINTMENT | Freq: Two times a day (BID) | CUTANEOUS | Status: DC
  Administered 2021-03-18 – 2021-03-21 (×6): 1 via NASAL
  Filled 2021-03-18: qty 22

## 2021-03-18 MED ORDER — MIDAZOLAM HCL 2 MG/2ML IJ SOLN: 0.5000 mg | Freq: Once | INTRAMUSCULAR | Status: DC | PRN

## 2021-03-18 MED ORDER — POLYETHYLENE GLYCOL 3350 17 G PO PACK: 17.0000 g | PACK | Freq: Every day | ORAL | Status: DC

## 2021-03-18 MED ORDER — SODIUM CHLORIDE 0.9% FLUSH: 3.0000 mL | INTRAVENOUS | Status: DC | PRN

## 2021-03-18 MED ORDER — FENTANYL CITRATE PF 50 MCG/ML IJ SOSY
50.0000 ug | PREFILLED_SYRINGE | Freq: Once | INTRAMUSCULAR | Status: AC
  Administered 2021-03-18: 50 ug via INTRAVENOUS

## 2021-03-18 MED ORDER — FENTANYL CITRATE (PF) 250 MCG/5ML IJ SOLN
INTRAMUSCULAR | Status: DC | PRN
  Administered 2021-03-18 (×2): 50 ug via INTRAVENOUS
  Administered 2021-03-18: 250 ug via INTRAVENOUS
  Administered 2021-03-18: 50 ug via INTRAVENOUS
  Administered 2021-03-18 (×2): 100 ug via INTRAVENOUS

## 2021-03-18 MED ORDER — CEFAZOLIN SODIUM-DEXTROSE 2-3 GM-%(50ML) IV SOLR
INTRAVENOUS | Status: DC | PRN
  Administered 2021-03-18 (×2): 2 g via INTRAVENOUS

## 2021-03-18 MED ORDER — SUGAMMADEX SODIUM 200 MG/2ML IV SOLN
INTRAVENOUS | Status: DC | PRN
  Administered 2021-03-18 (×2): 200 mg via INTRAVENOUS

## 2021-03-18 MED ORDER — CHLORHEXIDINE GLUCONATE CLOTH 2 % EX PADS
6.0000 | MEDICATED_PAD | Freq: Every day | CUTANEOUS | Status: DC
  Administered 2021-03-20 – 2021-03-21 (×2): 6 via TOPICAL

## 2021-03-18 MED ORDER — BUPIVACAINE HCL (PF) 0.5 % IJ SOLN
INTRAMUSCULAR | Status: AC
  Filled 2021-03-18: qty 30

## 2021-03-18 MED ORDER — SODIUM CHLORIDE 0.9% FLUSH
3.0000 mL | Freq: Two times a day (BID) | INTRAVENOUS | Status: DC
  Administered 2021-03-18 – 2021-03-21 (×5): 3 mL via INTRAVENOUS

## 2021-03-18 MED ORDER — ENOXAPARIN SODIUM 40 MG/0.4ML IJ SOSY
40.0000 mg | PREFILLED_SYRINGE | INTRAMUSCULAR | Status: DC
  Administered 2021-03-19: 40 mg via SUBCUTANEOUS
  Filled 2021-03-18: qty 0.4

## 2021-03-18 MED ORDER — ORAL CARE MOUTH RINSE: 15.0000 mL | Freq: Once | OROMUCOSAL | Status: AC

## 2021-03-18 MED ORDER — BUPIVACAINE LIPOSOME 1.3 % IJ SUSP
INTRAMUSCULAR | Status: AC
  Filled 2021-03-18: qty 20

## 2021-03-18 MED ORDER — ONDANSETRON HCL 4 MG/2ML IJ SOLN
INTRAMUSCULAR | Status: DC | PRN
  Administered 2021-03-18: 4 mg via INTRAVENOUS

## 2021-03-18 MED ORDER — METHOCARBAMOL 500 MG PO TABS: 500.0000 mg | ORAL_TABLET | Freq: Four times a day (QID) | ORAL | Status: DC | PRN

## 2021-03-18 MED ORDER — LACTATED RINGERS IV SOLN: INTRAVENOUS | Status: DC

## 2021-03-18 MED ORDER — FENTANYL BOLUS VIA INFUSION
50.0000 ug | INTRAVENOUS | Status: DC | PRN
  Administered 2021-03-18 – 2021-03-19 (×4): 100 ug via INTRAVENOUS
  Administered 2021-03-19: 50 ug via INTRAVENOUS
  Administered 2021-03-19 (×2): 100 ug via INTRAVENOUS
  Filled 2021-03-18: qty 100

## 2021-03-18 MED ORDER — FUROSEMIDE 10 MG/ML IJ SOLN
INTRAMUSCULAR | Status: AC
  Filled 2021-03-18: qty 4

## 2021-03-18 MED ORDER — CHLORHEXIDINE GLUCONATE CLOTH 2 % EX PADS
6.0000 | MEDICATED_PAD | Freq: Every day | CUTANEOUS | Status: DC
  Administered 2021-03-18 – 2021-03-21 (×3): 6 via TOPICAL

## 2021-03-18 MED ORDER — OXYCODONE HCL 5 MG/5ML PO SOLN: 5.0000 mg | Freq: Once | ORAL | Status: DC | PRN

## 2021-03-18 MED ORDER — HYDROMORPHONE HCL 1 MG/ML IJ SOLN
INTRAMUSCULAR | Status: DC | PRN
  Administered 2021-03-18 (×3): .25 mg via INTRAVENOUS

## 2021-03-18 MED ORDER — CHLORHEXIDINE GLUCONATE 0.12 % MT SOLN
OROMUCOSAL | Status: AC
  Administered 2021-03-18: 15 mL via OROMUCOSAL
  Filled 2021-03-18: qty 15

## 2021-03-18 MED ORDER — LIDOCAINE-EPINEPHRINE 1 %-1:100000 IJ SOLN
INTRAMUSCULAR | Status: AC
  Filled 2021-03-18: qty 1

## 2021-03-18 MED ORDER — FENTANYL 2500MCG IN NS 250ML (10MCG/ML) PREMIX INFUSION
0.0000 ug/h | INTRAVENOUS | Status: DC
  Administered 2021-03-18: 100 ug/h via INTRAVENOUS
  Administered 2021-03-19: 400 ug/h via INTRAVENOUS
  Administered 2021-03-19: 200 ug/h via INTRAVENOUS
  Administered 2021-03-20: 250 ug/h via INTRAVENOUS
  Filled 2021-03-18 (×4): qty 250

## 2021-03-18 MED ORDER — ROCURONIUM BROMIDE 10 MG/ML (PF) SYRINGE
PREFILLED_SYRINGE | INTRAVENOUS | Status: DC | PRN
  Administered 2021-03-18: 20 mg via INTRAVENOUS
  Administered 2021-03-18: 50 mg via INTRAVENOUS
  Administered 2021-03-18: 100 mg via INTRAVENOUS
  Administered 2021-03-18: 50 mg via INTRAVENOUS
  Administered 2021-03-18: 30 mg via INTRAVENOUS
  Administered 2021-03-18: 50 mg via INTRAVENOUS
  Administered 2021-03-18: 20 mg via INTRAVENOUS

## 2021-03-18 MED ORDER — PROPOFOL 1000 MG/100ML IV EMUL
0.0000 ug/kg/min | INTRAVENOUS | Status: DC
Start: 2021-03-18 — End: 2021-03-19
  Administered 2021-03-18 – 2021-03-19 (×6): 50 ug/kg/min via INTRAVENOUS
  Administered 2021-03-19: 25 ug/kg/min via INTRAVENOUS
  Filled 2021-03-18 (×7): qty 100

## 2021-03-18 MED ORDER — DIPHENHYDRAMINE HCL 50 MG/ML IJ SOLN
INTRAMUSCULAR | Status: AC
  Administered 2021-03-18: 50 mg
  Filled 2021-03-18: qty 1

## 2021-03-18 MED ORDER — CEFAZOLIN SODIUM-DEXTROSE 2-4 GM/100ML-% IV SOLN
INTRAVENOUS | Status: AC
  Filled 2021-03-18: qty 100

## 2021-03-18 MED ORDER — OXYCODONE HCL 5 MG PO TABS: 5.0000 mg | ORAL_TABLET | Freq: Once | ORAL | Status: DC | PRN

## 2021-03-18 MED ORDER — SUGAMMADEX SODIUM 200 MG/2ML IV SOLN: INTRAVENOUS | Status: DC | PRN

## 2021-03-18 MED ORDER — SODIUM CHLORIDE 0.9 % IV SOLN: 250.0000 mL | INTRAVENOUS | Status: DC

## 2021-03-18 MED ORDER — DOCUSATE SODIUM 50 MG/5ML PO LIQD: 100.0000 mg | Freq: Two times a day (BID) | ORAL | Status: DC

## 2021-03-18 MED ORDER — LACTATED RINGERS IV SOLN: INTRAVENOUS | Status: DC | PRN

## 2021-03-18 MED ORDER — MENTHOL 3 MG MT LOZG
1.0000 | LOZENGE | OROMUCOSAL | Status: DC | PRN
  Filled 2021-03-18: qty 9

## 2021-03-18 MED ORDER — BUPIVACAINE LIPOSOME 1.3 % IJ SUSP
INTRAMUSCULAR | Status: DC | PRN
  Administered 2021-03-18: 20 mL

## 2021-03-18 MED ORDER — CEFAZOLIN SODIUM-DEXTROSE 1-4 GM/50ML-% IV SOLN
1.0000 g | Freq: Three times a day (TID) | INTRAVENOUS | Status: AC
  Administered 2021-03-18 – 2021-03-19 (×3): 1 g via INTRAVENOUS
  Filled 2021-03-18 (×3): qty 50

## 2021-03-18 SURGICAL SUPPLY — 63 items
ADH SKN CLS APL DERMABOND .7 (GAUZE/BANDAGES/DRESSINGS) ×2
BAG COUNTER SPONGE SURGICOUNT (BAG) ×6 IMPLANT
BAG SPNG CNTER NS LX DISP (BAG) ×4
BAND INSRT 18 STRL LF DISP RB (MISCELLANEOUS) ×8
BAND RUBBER #18 3X1/16 STRL (MISCELLANEOUS) ×12 IMPLANT
BIT DRILL LONG 3.0X30 (BIT) ×1 IMPLANT
BLADE CLIPPER SURG (BLADE) IMPLANT
CANISTER SUCT 3000ML PPV (MISCELLANEOUS) ×6 IMPLANT
DECANTER SPIKE VIAL GLASS SM (MISCELLANEOUS) ×6 IMPLANT
DERMABOND ADVANCED (GAUZE/BANDAGES/DRESSINGS) ×1
DERMABOND ADVANCED .7 DNX12 (GAUZE/BANDAGES/DRESSINGS) IMPLANT
DRAPE C-ARM 42X72 X-RAY (DRAPES) ×12 IMPLANT
DRAPE C-ARMOR (DRAPES) ×1 IMPLANT
DRAPE LAPAROTOMY 100X72X124 (DRAPES) ×6 IMPLANT
DRAPE SURG 17X23 STRL (DRAPES) ×6 IMPLANT
DRSG OPSITE POSTOP 4X10 (GAUZE/BANDAGES/DRESSINGS) ×1 IMPLANT
DRSG OPSITE POSTOP 4X6 (GAUZE/BANDAGES/DRESSINGS) ×2 IMPLANT
DURAPREP 26ML APPLICATOR (WOUND CARE) ×6 IMPLANT
ELECT BLADE INSULATED 6.5IN (ELECTROSURGICAL) ×6
ELECT COATED BLADE 2.86 ST (ELECTRODE) ×1 IMPLANT
ELECT REM PT RETURN 9FT ADLT (ELECTROSURGICAL) ×6
ELECTRODE BLDE INSULATED 6.5IN (ELECTROSURGICAL) ×4 IMPLANT
ELECTRODE REM PT RTRN 9FT ADLT (ELECTROSURGICAL) ×4 IMPLANT
EXTENDER TAB GUIDE SV 5.5/6.0 (INSTRUMENTS) ×24 IMPLANT
GAUZE 4X4 16PLY ~~LOC~~+RFID DBL (SPONGE) ×4 IMPLANT
GAUZE SPONGE 4X4 12PLY STRL (GAUZE/BANDAGES/DRESSINGS) IMPLANT
GLOVE SURG LTX SZ7.5 (GLOVE) ×6 IMPLANT
GLOVE SURG UNDER POLY LF SZ7.5 (GLOVE) ×6 IMPLANT
GOWN STRL REUS W/ TWL LRG LVL3 (GOWN DISPOSABLE) ×8 IMPLANT
GOWN STRL REUS W/ TWL XL LVL3 (GOWN DISPOSABLE) ×4 IMPLANT
GOWN STRL REUS W/TWL 2XL LVL3 (GOWN DISPOSABLE) IMPLANT
GOWN STRL REUS W/TWL LRG LVL3 (GOWN DISPOSABLE) ×12
GOWN STRL REUS W/TWL XL LVL3 (GOWN DISPOSABLE) ×6
GUIDEWIRE BLUNT NT 450 (WIRE) ×12 IMPLANT
HEMOSTAT POWDER KIT SURGIFOAM (HEMOSTASIS) ×7 IMPLANT
KIT BASIN OR (CUSTOM PROCEDURE TRAY) ×6 IMPLANT
KIT SPINE MAZOR X ROBO DISP (MISCELLANEOUS) ×1 IMPLANT
KIT TURNOVER KIT B (KITS) ×6 IMPLANT
NDL HYPO 18GX1.5 BLUNT FILL (NEEDLE) IMPLANT
NEEDLE HYPO 18GX1.5 BLUNT FILL (NEEDLE) IMPLANT
NEEDLE HYPO 22GX1.5 SAFETY (NEEDLE) ×6 IMPLANT
NS IRRIG 1000ML POUR BTL (IV SOLUTION) ×6 IMPLANT
PACK LAMINECTOMY NEURO (CUSTOM PROCEDURE TRAY) ×6 IMPLANT
PAD ARMBOARD 7.5X6 YLW CONV (MISCELLANEOUS) ×18 IMPLANT
ROD PERC CCM 5.5X65 (Rod) ×2 IMPLANT
ROD STRT PERC 5.5X160 (Rod) ×2 IMPLANT
SCREW FENS MAS 6.5X50 (Screw) ×2 IMPLANT
SCREW MA FENS 4.5X40 (Screw) ×2 IMPLANT
SCREW MA FENS 4.5X45 (Screw) ×2 IMPLANT
SCREW MA FENS 5.5X45 (Screw) ×6 IMPLANT
SCREW SCHANZ SA 4.0MM (MISCELLANEOUS) ×1 IMPLANT
SCREW SET 5.5/6.0MM SOLERA (Screw) ×12 IMPLANT
SPONGE SURGIFOAM ABS GEL SZ50 (HEMOSTASIS) IMPLANT
SPONGE T-LAP 4X18 ~~LOC~~+RFID (SPONGE) ×1 IMPLANT
SUT MNCRL AB 4-0 PS2 18 (SUTURE) ×7 IMPLANT
SUT VIC AB 0 CT1 18XCR BRD8 (SUTURE) IMPLANT
SUT VIC AB 0 CT1 8-18 (SUTURE) ×12
SUT VIC AB 2-0 CP2 18 (SUTURE) ×7 IMPLANT
TOWEL GREEN STERILE (TOWEL DISPOSABLE) ×6 IMPLANT
TOWEL GREEN STERILE FF (TOWEL DISPOSABLE) ×6 IMPLANT
TUBE CONNECTING 12X1/4 (SUCTIONS) ×1 IMPLANT
TUBE MAZOR SA REDUCTION (TUBING) ×1 IMPLANT
WATER STERILE IRR 1000ML POUR (IV SOLUTION) ×6 IMPLANT

## 2021-03-18 NOTE — Progress Notes (Signed)
Communicated with Sean Allegra, NP regarding OG tube since he has oral/PT medications due. Instructed to hold off on PG placement in hopes of future extubation. Instructed to switch oral meds to IV if possible and hold others that are not. Communicated this call to Alvino Chapel, Charity fundraiser.

## 2021-03-18 NOTE — Anesthesia Preprocedure Evaluation (Addendum)
Anesthesia Evaluation  Patient identified by MRN, date of birth, ID band Patient awake    Reviewed: Allergy & Precautions, NPO status , Patient's Chart, lab work & pertinent test results  History of Anesthesia Complications Negative for: history of anesthetic complications  Airway Mallampati: I  TM Distance: >3 FB Neck ROM: Full    Dental  (+) Dental Advisory Given   Pulmonary Current Smoker and Patient abstained from smoking.,  Small bilat PTX R rib 5 fracture   breath sounds clear to auscultation       Cardiovascular negative cardio ROS   Rhythm:Regular Rate:Normal     Neuro/Psych T1, T5, T6, L1 fractures    GI/Hepatic Neg liver ROS, GERD  Poorly Controlled,  Endo/Other  negative endocrine ROS  Renal/GU negative Renal ROS     Musculoskeletal   Abdominal   Peds  Hematology negative hematology ROS (+)   Anesthesia Other Findings [facility]: Multiple back fractures inclduing T1 SP fx, T5 superior endplace compression fx, T6 burst fx, L1 burst fx w/ partial effacement of the CSF surroudning the distal thoracic cord-PerNSGY, Dr. [name].MRIobtained. Plan for surgery today. Brace. Bedrest. HOB okay to be elevated to 15 degrees. L1 R TP fx -pain control R humeral head fx -Per Ortho,Dr. Blackman,non-op management, avoid abduction and external rotation. Sling for comfort. PT/OT. Follow up outpatient. R occult PTX, question L occult PTX - repeat CXRwithout PTX, IS/pulm toilet R 5th rib fx - IS/pulm toilet L lateral lower leg pain-Xray and CT negative for fracture. Per Ortho. They suspect at least irritation of the superficial peroneal nerve. ? If related to back fx's/canal stenosis. On gabapentin. Dr. [name] discussed with Rads. Plan for stress films when can get out of bed. Currently on bedrest.  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 with  associated chronic appearing noncommunicating hydrocephalus.Outpatient   Reproductive/Obstetrics                            Anesthesia Physical Anesthesia Plan  ASA: 2  Anesthesia Plan: General   Post-op Pain Management:    Induction: Intravenous  PONV Risk Score and Plan: 1 and Ondansetron, Dexamethasone and Treatment may vary due to age or medical condition  Airway Management Planned: Oral ETT  Additional Equipment: None  Intra-op Plan:   Post-operative Plan: Extubation in OR  Informed Consent: I have reviewed the patients History and Physical, chart, labs and discussed the procedure including the risks, benefits and alternatives for the proposed anesthesia with the patient or authorized representative who has indicated his/her understanding and acceptance.     Dental advisory given  Plan Discussed with: CRNA and Surgeon  Anesthesia Plan Comments:         Anesthesia Quick Evaluation

## 2021-03-18 NOTE — Progress Notes (Signed)
Subjective: Patient c/o focal left lower leg pain.  Back pain is not severe while lying flat  Objective: Vital signs in last 24 hours: Temp:  [97.2 F (36.2 C)-98.7 F (37.1 C)] 98.7 F (37.1 C) (10/25 0959) Pulse Rate:  [91-101] 101 (10/25 0726) Resp:  [13-20] 20 (10/25 0726) BP: (139-158)/(75-100) 147/100 (10/25 0959) SpO2:  [90 %-96 %] 96 % (10/25 0726)  Intake/Output from previous day: 10/24 0701 - 10/25 0700 In: 690 [P.O.:690] Out: 3374 [Urine:3374] Intake/Output this shift: Total I/O In: 0  Out: 800 [Urine:800]  Awake, alert, Ox3 Left lower leg slightly swollen, +allodynia 3/5 DF, PF on left, 5/5 on right  Lab Results: Recent Labs    03/17/21 1247 03/18/21 0252  WBC 7.9 7.9  HGB 14.5 14.0  HCT 43.4 42.4  PLT 170 165   BMET Recent Labs    03/17/21 1247 03/18/21 0252  NA 135 136  K 4.1 4.0  CL 101 102  CO2 27 29  GLUCOSE 103* 130*  BUN 5* 6  CREATININE 0.79 0.84  CALCIUM 8.7* 8.5*    Studies/Results: CT TIBIA FIBULA LEFT WO CONTRAST  Result Date: 03/16/2021 CLINICAL DATA:  Left leg pain, concern for stress fracture. EXAM: CT OF THE LOWER LEFT EXTREMITY WITHOUT CONTRAST TECHNIQUE: Multidetector CT imaging of the lower left extremity was performed according to the standard protocol. COMPARISON:  Tibia and fibula radiographs dated 03/16/2021. FINDINGS: Bones/Joint/Cartilage No acute osseous injury. No periosteal reaction or evidence of stress fracture. No joint dislocation or significant joint space narrowing. Ligaments Suboptimally assessed by CT. Muscles and Tendons Normal muscle bulk. Soft tissues Normal. IMPRESSION: No acute osseous injury. Of note, MRI is the most sensitive modality to detect stress fracture. Electronically Signed   By: Romona Curls M.D.   On: 03/16/2021 18:23   DG CHEST PORT 1 VIEW  Result Date: 03/17/2021 CLINICAL DATA:  Pneumothorax EXAM: PORTABLE CHEST 1 VIEW COMPARISON:  Portable exam 1156 hours compared to 03/15/2021 FINDINGS:  Normal heart size, mediastinal contours, and pulmonary vascularity. Bronchitic changes and bibasilar atelectasis. Lungs otherwise clear. No acute infiltrate, pleural effusion, or pneumothorax. Osseous structures unremarkable. No pulmonary infiltrate IMPRESSION: Bibasilar atelectasis. Electronically Signed   By: Ulyses Southward M.D.   On: 03/17/2021 14:56    Assessment/Plan: T6 and T13 burst fractures - plan for operative reduction and fixation today in ER - likely will need nonurgent MR brain for further characterization of his likely colloid cyst  [name] 03/18/2021, 10:21 AM

## 2021-03-18 NOTE — Progress Notes (Signed)
Day of Surgery  Subjective: CC: Patient better pain control after adjustments are made yesterday.  Still having some pain in his back as well as his left lower leg.  The left lower leg pain is still a burning sensation that radiates from his mid lateral leg to the top of his foot that is worse with palpation.  Still able range of motion.  Patient tolerated diet yesterday without any nausea or vomiting.  No abdominal pain.  Passing flatus.  No BM.  Voiding.  Shortness of breath improving.  On 2 L.  Objective: Vital signs in last 24 hours: Temp:  [97.2 F (36.2 C)-98.3 F (36.8 C)] 98 F (36.7 C) (10/25 0726) Pulse Rate:  [91-101] 101 (10/25 0726) Resp:  [13-20] 20 (10/25 0726) BP: (139-158)/(75-92) 148/92 (10/25 0726) SpO2:  [90 %-96 %] 96 % (10/25 0726) Last BM Date:  (PTA)  Intake/Output from previous day: 10/24 0701 - 10/25 0700 In: 690 [P.O.:690] Out: 3374 [Urine:3374] Intake/Output this shift: Total I/O In: 0  Out: 800 [Urine:800]  PE: Gen:  Alert, NAD, pleasant HEENT: EOM's intact, pupils equal and round Card:  Tachycardic with regular rhythm. Radial and DP 2+ b/l Pulm:  CTAB, no W/R/R, effort normal. On o2, 2L. 1250 on IS.  Abd: Soft, mild distension, NT +BS Msk:  Back brace in place. No sling on RUE. TTP at the lateral mid left lower leg with burning pain described with palpation along this and the top of his foot. Able plantarflexion/dorsiflexion of the left foot.  Moves all digits of the left foot. Psych: A&Ox3  Skin: no rashes noted, warm and dry  Lab Results:  Recent Labs    03/17/21 1247 03/18/21 0252  WBC 7.9 7.9  HGB 14.5 14.0  HCT 43.4 42.4  PLT 170 165   BMET Recent Labs    03/17/21 1247 03/18/21 0252  NA 135 136  K 4.1 4.0  CL 101 102  CO2 27 29  GLUCOSE 103* 130*  BUN 5* 6  CREATININE 0.79 0.84  CALCIUM 8.7* 8.5*   PT/INR Recent Labs    03/15/21 1934  LABPROT 14.3  INR 1.1   CMP     Component Value Date/Time   NA 136  03/18/2021 0252   K 4.0 03/18/2021 0252   CL 102 03/18/2021 0252   CO2 29 03/18/2021 0252   GLUCOSE 130 (H) 03/18/2021 0252   BUN 6 03/18/2021 0252   CREATININE 0.84 03/18/2021 0252   CALCIUM 8.5 (L) 03/18/2021 0252   PROT 6.7 03/15/2021 1934   ALBUMIN 3.9 03/15/2021 1934   AST 35 03/15/2021 1934   ALT 37 03/15/2021 1934   ALKPHOS 46 03/15/2021 1934   BILITOT 0.7 03/15/2021 1934   GFRNONAA >60 03/18/2021 0252   Lipase  No results found for: LIPASE  Studies/Results: CT TIBIA FIBULA LEFT WO CONTRAST  Result Date: 03/16/2021 CLINICAL DATA:  Left leg pain, concern for stress fracture. EXAM: CT OF THE LOWER LEFT EXTREMITY WITHOUT CONTRAST TECHNIQUE: Multidetector CT imaging of the lower left extremity was performed according to the standard protocol. COMPARISON:  Tibia and fibula radiographs dated 03/16/2021. FINDINGS: Bones/Joint/Cartilage No acute osseous injury. No periosteal reaction or evidence of stress fracture. No joint dislocation or significant joint space narrowing. Ligaments Suboptimally assessed by CT. Muscles and Tendons Normal muscle bulk. Soft tissues Normal. IMPRESSION: No acute osseous injury. Of note, MRI is the most sensitive modality to detect stress fracture. Electronically Signed   By: Foye Spurling.D.  On: 03/16/2021 18:23   DG CHEST PORT 1 VIEW  Result Date: 03/17/2021 CLINICAL DATA:  Pneumothorax EXAM: PORTABLE CHEST 1 VIEW COMPARISON:  Portable exam 1156 hours compared to 03/15/2021 FINDINGS: Normal heart size, mediastinal contours, and pulmonary vascularity. Bronchitic changes and bibasilar atelectasis. Lungs otherwise clear. No acute infiltrate, pleural effusion, or pneumothorax. Osseous structures unremarkable. No pulmonary infiltrate IMPRESSION: Bibasilar atelectasis. Electronically Signed   By: Ulyses Southward M.D.   On: 03/17/2021 14:56    Anti-infectives: Anti-infectives (From admission, onward)    None        Assessment/Plan [facility] Multiple back  fractures inclduing T1 SP fx, T5 superior endplace compression fx, T6 burst fx, L1 burst fx w/ partial effacement of the CSF surroudning the distal thoracic cord - Per NSGY, Dr. [name]. MRI obtained. Plan for surgery today. Brace. Bedrest. HOB okay to be elevated to 15 degrees.  L1 R TP fx - pain control R humeral head fx - Per Ortho, Dr. [name], non-op management, avoid abduction and external rotation. Sling for comfort. PT/OT. Follow up outpatient.  R occult PTX, question L occult PTX - repeat CXR without PTX, IS/pulm toilet R 5th rib fx - IS/pulm toilet L lateral lower leg pain - Xray and CT negative for fracture. Per Ortho. They suspect at least irritation of the superficial peroneal nerve. ? If related to back fx's/canal stenosis. On gabapentin. Dr. [name] discussed with Rads. Plan for stress films when can get out of bed. Currently on bedrest.  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 Outpatient follow up. Radiology recommending MRI with and without contrast for further evaluation. FEN - NPO for procedure, can return to regular diet postop. IVF. Bowel regimen DVT - SCDs, Lovenox  Dispo - OR with NSGY.    LOS: 3 days    Jacinto Halim , [facility] 03/18/2021, 9:26 AM Please see Amion for pager number during day hours 7:00am-4:30pm
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
## 2021-03-18 NOTE — Transfer of Care (Signed)
Immediate Anesthesia Transfer of Care Note  Patient: Sean Soto  Procedure(s) Performed: THORACIC TEN- LUMBAR TWO POSTERIOR FUSION WITH LAMINECTOMY, REDUCTION FIXATION (Spine Thoracic) THORACIC FIVE-SEVEN POSTERIOR PERCUTANEOUS FIXATION (Spine Thoracic) APPLICATION OF ROBOTIC ASSISTANCE FOR SPINAL PROCEDURE (Spine Thoracic)  Patient Location: SICU  Anesthesia Type:General  Level of Consciousness: Patient remains intubated per anesthesia plan  Airway & Oxygen Therapy: Patient re-intubated  Post-op Assessment: Report given to RN, Post -op Vital signs reviewed and stable and Patient moving all extremities X 4  Post vital signs: Reviewed and stable  Last Vitals:  Vitals Value Taken Time  BP 148/93 03/18/21 2015  Temp    Pulse 115 03/18/21 2015  Resp 18 03/18/21 2015  SpO2 91 % 03/18/21 2015  Vitals shown include unvalidated device data.  Last Pain:  Vitals:   03/18/21 1044  TempSrc:   PainSc: 7          Complications: No notable events documented.

## 2021-03-18 NOTE — Anesthesia Postprocedure Evaluation (Signed)
Anesthesia Post Note  Patient: VLADISLAV AXELSON  Procedure(s) Performed: THORACIC TEN- LUMBAR TWO POSTERIOR FUSION WITH LAMINECTOMY, REDUCTION FIXATION (Spine Thoracic) THORACIC FIVE-SEVEN POSTERIOR PERCUTANEOUS FIXATION (Spine Thoracic) APPLICATION OF ROBOTIC ASSISTANCE FOR SPINAL PROCEDURE (Spine Thoracic)     Patient location during evaluation: ICU Anesthesia Type: General Level of consciousness: sedated Pain management: pain level controlled Vital Signs Assessment: post-procedure vital signs reviewed and stable Respiratory status: patient on ventilator - see flowsheet for VS (required re-intubation from facial and airway swelling with prolonged surgery in prone position) Cardiovascular status: blood pressure returned to baseline and stable Postop Assessment: no apparent nausea or vomiting Anesthetic complications: no Comments: Discussed with pt's wife   No notable events documented.  Last Vitals:  Vitals:   03/18/21 2000 03/18/21 2028  BP:  (!) 148/93  Pulse:  (!) 121  Resp:  (!) 21  Temp: 37.7 C   SpO2:  91%    Last Pain:  Vitals:   03/18/21 2000  TempSrc: Oral  PainSc:                  Chavela Justiniano,E. Moataz Tavis

## 2021-03-18 NOTE — Plan of Care (Signed)

## 2021-03-18 NOTE — Progress Notes (Signed)
Patient arrived to 4N26 from OR at 20:00. Blood pressure stable, Tachy at 120 (unchanged), Spo2 92% on vent respiratory at bedside. TLSO in place. Pupils 2 equal and brisk. Withdraw from stimulis x 4. Megan S.O. at bedside. Facial and oral swelling present/continued. Order received from neurosurgeon to give a dose of Benadryl. Fentanyl gtt started.   Oliver Barre, RN

## 2021-03-18 NOTE — Progress Notes (Signed)
Neurosurgery  Patient currently satting 93% on ventilator.  HR 119, BP 150/92.  He has been given Lasix and is demonstrating good urine output response.  It appears his facial swelling has already improved.  He localizes in both upper extremities and withdraws strongly in both lower extremities.  PERRL.  Although he has some tachycardia, given the evidence of some pulmonary edema on his CXR which would explain his hypoxemia, I do not think he requires a PE protocol CT at this time.  I am hoping he will continue to improve to the point of possible extubation tomorrow if he has a cuff leak.

## 2021-03-18 NOTE — Anesthesia Procedure Notes (Signed)
Procedure Name: Intubation Date/Time: 03/18/2021 7:25 PM Performed by: Lanell Matar, CRNA Pre-anesthesia Checklist: Emergency Drugs available Patient Re-evaluated:Patient Re-evaluated prior to induction Oxygen Delivery Method: Circle system utilized Induction Type: IV induction and Rapid sequence Laryngoscope Size: Glidescope Grade View: Grade I Tube type: Oral Tube size: 8.0 mm Number of attempts: 1 Airway Equipment and Method: Stylet and Patient positioned with wedge pillow Placement Confirmation: ETT inserted through vocal cords under direct vision and positive ETCO2 Secured at: 24 cm Tube secured with: Tape Dental Injury: Teeth and Oropharynx as per pre-operative assessment

## 2021-03-19 ENCOUNTER — Inpatient Hospital Stay (HOSPITAL_COMMUNITY): Payer: Self-pay

## 2021-03-19 LAB — POCT I-STAT 7, (LYTES, BLD GAS, ICA,H+H)
Acid-Base Excess: 6 mmol/L — ABNORMAL HIGH (ref 0.0–2.0)
Bicarbonate: 30.7 mmol/L — ABNORMAL HIGH (ref 20.0–28.0)
Calcium, Ion: 1.05 mmol/L — ABNORMAL LOW (ref 1.15–1.40)
HCT: 36 % — ABNORMAL LOW (ref 39.0–52.0)
Hemoglobin: 12.2 g/dL — ABNORMAL LOW (ref 13.0–17.0)
O2 Saturation: 93 %
Patient temperature: 100
Potassium: 4.1 mmol/L (ref 3.5–5.1)
Sodium: 137 mmol/L (ref 135–145)
TCO2: 32 mmol/L (ref 22–32)
pCO2 arterial: 46.7 mmHg (ref 32.0–48.0)
pH, Arterial: 7.429 (ref 7.350–7.450)
pO2, Arterial: 70 mmHg — ABNORMAL LOW (ref 83.0–108.0)

## 2021-03-19 LAB — BASIC METABOLIC PANEL
Anion gap: 6 (ref 5–15)
Anion gap: 6 (ref 5–15)
BUN: 6 mg/dL (ref 6–20)
BUN: 10 mg/dL (ref 6–20)
CO2: 29 mmol/L (ref 22–32)
CO2: 32 mmol/L (ref 22–32)
Calcium: 7.9 mg/dL — ABNORMAL LOW (ref 8.9–10.3)
Calcium: 8 mg/dL — ABNORMAL LOW (ref 8.9–10.3)
Chloride: 101 mmol/L (ref 98–111)
Chloride: 99 mmol/L (ref 98–111)
Creatinine, Ser: 0.82 mg/dL (ref 0.61–1.24)
Creatinine, Ser: 0.9 mg/dL (ref 0.61–1.24)
GFR, Estimated: >60 mL/min (ref >60)
GFR, Estimated: >60 mL/min (ref >60)
Glucose, Bld: 122 mg/dL — ABNORMAL HIGH (ref 70–99)
Glucose, Bld: 116 mg/dL — ABNORMAL HIGH (ref 70–99)
Potassium: 3.7 mmol/L (ref 3.5–5.1)
Potassium: 3.3 mmol/L — ABNORMAL LOW (ref 3.5–5.1)
Sodium: 136 mmol/L (ref 135–145)
Sodium: 137 mmol/L (ref 135–145)

## 2021-03-19 LAB — CBC
HCT: 37.4 % — ABNORMAL LOW (ref 39.0–52.0)
Hemoglobin: 12.2 g/dL — ABNORMAL LOW (ref 13.0–17.0)
MCH: 29.2 pg (ref 26.0–34.0)
MCHC: 32.6 g/dL (ref 30.0–36.0)
MCV: 89.5 fL (ref 80.0–100.0)
Platelets: 208 10*3/uL (ref 150–400)
RBC: 4.18 MIL/uL — ABNORMAL LOW (ref 4.22–5.81)
RDW: 12.8 % (ref 11.5–15.5)
WBC: 8.3 10*3/uL (ref 4.0–10.5)
nRBC: 0 % (ref 0.0–0.2)

## 2021-03-19 LAB — TRIGLYCERIDES: Triglycerides: 125 mg/dL (ref <150)

## 2021-03-19 MED ORDER — PROPOFOL 1000 MG/100ML IV EMUL
0.0000 ug/kg/min | INTRAVENOUS | Status: DC
  Administered 2021-03-20: 20 ug/kg/min via INTRAVENOUS
  Administered 2021-03-20: 25 ug/kg/min via INTRAVENOUS
  Filled 2021-03-19 (×3): qty 100

## 2021-03-19 MED ORDER — DEXMEDETOMIDINE HCL IN NACL 400 MCG/100ML IV SOLN
0.4000 ug/kg/h | INTRAVENOUS | Status: DC
Start: 2021-03-19 — End: 2021-03-20
  Administered 2021-03-19: 0.4 ug/kg/h via INTRAVENOUS
  Administered 2021-03-19: 1 ug/kg/h via INTRAVENOUS
  Administered 2021-03-20: 0.6 ug/kg/h via INTRAVENOUS
  Filled 2021-03-19 (×4): qty 100
  Filled 2021-03-19: qty 200

## 2021-03-19 MED ORDER — FUROSEMIDE 10 MG/ML IJ SOLN
60.0000 mg | Freq: Once | INTRAMUSCULAR | Status: AC
  Administered 2021-03-19: 60 mg via INTRAVENOUS
  Filled 2021-03-19: qty 6

## 2021-03-19 MED ORDER — POTASSIUM CHLORIDE 10 MEQ/100ML IV SOLN
10.0000 meq | INTRAVENOUS | Status: AC
  Administered 2021-03-19 – 2021-03-20 (×6): 10 meq via INTRAVENOUS
  Filled 2021-03-19 (×5): qty 100

## 2021-03-19 MED ORDER — ENOXAPARIN SODIUM 40 MG/0.4ML IJ SOSY
40.0000 mg | PREFILLED_SYRINGE | Freq: Two times a day (BID) | INTRAMUSCULAR | Status: DC
  Administered 2021-03-20 – 2021-03-21 (×3): 40 mg via SUBCUTANEOUS
  Filled 2021-03-19 (×3): qty 0.4

## 2021-03-19 MED ORDER — CHLORHEXIDINE GLUCONATE 0.12% ORAL RINSE (MEDLINE KIT)
15.0000 mL | Freq: Two times a day (BID) | OROMUCOSAL | Status: DC
  Administered 2021-03-19 – 2021-03-20 (×4): 15 mL via OROMUCOSAL

## 2021-03-19 MED ORDER — ORAL CARE MOUTH RINSE
15.0000 mL | OROMUCOSAL | Status: DC
  Administered 2021-03-19 – 2021-03-20 (×12): 15 mL via OROMUCOSAL

## 2021-03-19 MED ORDER — MIDAZOLAM HCL 2 MG/2ML IJ SOLN
2.0000 mg | INTRAMUSCULAR | Status: DC | PRN
  Administered 2021-03-19 – 2021-03-20 (×2): 2 mg via INTRAVENOUS
  Filled 2021-03-19 (×2): qty 2

## 2021-03-19 MED FILL — Thrombin For Soln 5000 Unit: CUTANEOUS | Qty: 5000 | Status: AC
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
## 2021-03-19 NOTE — Progress Notes (Signed)
Cortrak Tube Team Note:  Consult received to place a Cortrak feeding tube. RD went to pt's room to place Cortrak but pt refused to have tube placed. MD and RN informed. If pt decides to proceed with placement, please contact the Cortrak team at [email or web address] (password [email or web address]) for placement.    Eugene Gavia, MS, RD, LDN (she/her/hers) RD pager number and weekend/on-call pager number located in Amion.

## 2021-03-19 NOTE — Progress Notes (Signed)
   Inpatient Rehab Admissions Coordinator :  Per therapy recommendations patient was screened for CIR candidacy by Ottie Glazier RN MSN. Patient is not yet at a level to tolerate the intensity required to pursue a CIR admit due to remains intubated. Patient may have the potential to progress to become a candidate. The CIR admissions team will follow and monitor for progress and place a Rehab Consult order if felt to be appropriate. Please contact me with any questions.  Ottie Glazier RN MSN Admissions Coordinator [phone]

## 2021-03-19 NOTE — TOC CAGE-AID Note (Signed)
Transition of Care [facility]) - CAGE-AID Screening   Patient Details  Name: RANDIE TALLARICO MRN: [id number] Date of Birth: Apr 13, 1986  [facility]) CM/SW Contact:    Micheale Schlack C Tarpley-Carter, LCSWA Phone Number: 03/19/2021, 12:12 PM   Clinical Narrative: Pt participated in Cage-Aid.  Pt stated he does not use substance or ETOH.  Pt was not offered resources, due to no usage of substance or ETOH.     Milinda Sweeney Tarpley-Carter, MSW, LCSW-A Pronouns:  She/Her/Hers [facility] Clinical Social Worker Direct Number:  [phone] [email or web address]   CAGE-AID Screening:    Have You Ever Felt You Ought to Cut Down on Your Drinking or Drug Use?: No Have People Annoyed You By [employer] Your Drinking Or Drug Use?: No Have You Felt Bad Or Guilty About Your Drinking Or Drug Use?: No Have You Ever Had a Drink or Used Drugs First Thing In The Morning to Steady Your Nerves or to Get Rid of a Hangover?: No CAGE-AID Score: 0  Substance Abuse Education Offered: No
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
## 2021-03-19 NOTE — Progress Notes (Signed)
Subjective: NAEs o/n  Objective: Vital signs in last 24 hours: Temp:  [98.8 F (37.1 C)-100 F (37.8 C)] 98.8 F (37.1 C) (10/26 1600) Pulse Rate:  [83-125] 83 (10/26 1915) Resp:  [16-27] 18 (10/26 1915) BP: (83-150)/(53-97) 88/59 (10/26 1915) SpO2:  [91 %-100 %] 95 % (10/26 1938) FiO2 (%):  [40 %-100 %] 40 % (10/26 1938) Weight:  [123.3 kg] 123.3 kg (10/25 2100)  Intake/Output from previous day: 10/25 0701 - 10/26 0700 In: 11308.7 [I.V.:11008.7; IV Piggyback:300] Out: 5510 [Urine:4910; Blood:600] Intake/Output this shift: No intake/output data recorded. Patient intubated but communicating via gestures and writing, awake and alert Full strength in Ues and RLE.  L DF and PF remains 3/5.  Per nursing, dressings c/d  Lab Results: Recent Labs    03/18/21 0252 03/19/21 0400 03/19/21 0458  WBC 7.9  --  8.3  HGB 14.0 12.2* 12.2*  HCT 42.4 36.0* 37.4*  PLT 165  --  208   BMET Recent Labs    03/19/21 0458 03/19/21 1711  NA 136 137  K 3.7 3.3*  CL 101 99  CO2 29 32  GLUCOSE 122* 116*  BUN 6 10  CREATININE 0.82 0.90  CALCIUM 7.9* 8.0*    Studies/Results: DG THORACOLUMABAR SPINE  Result Date: 03/18/2021 CLINICAL DATA:  Surgery thoracic spine EXAM: THORACOLUMBAR SPINE 1V COMPARISON:  MRI 03/16/2021 FINDINGS: Seven low resolution intraoperative spot views of the thoracic spine. Total fluoroscopy time was 2 minutes. Slightly limited visibility secondary to multiple artifacts. Surgical instrumentation of the thoracic spine.Limited landmarks due to small field of view IMPRESSION: Intraoperative fluoroscopic assistance provided during thoracolumbar spine surgery Electronically Signed   By: Jasmine Pang M.D.   On: 03/18/2021 21:05   DG Chest Port 1 View  Result Date: 03/19/2021 CLINICAL DATA:  Respiratory failure. EXAM: PORTABLE CHEST 1 VIEW COMPARISON:  Yesterday FINDINGS: Endotracheal tube with tip just below the clavicular heads. Low volume chest with hazy and streaky  density bilaterally. Mildly improved aeration with better seen left diaphragm. No visible effusion or pneumothorax. Normal heart size. IMPRESSION: Mildly improved aeration. Unremarkable endotracheal tube positioning. Electronically Signed   By: Tiburcio Pea M.D.   On: 03/19/2021 09:38   DG Chest Port 1 View  Result Date: 03/18/2021 CLINICAL DATA:  Pneumothorax EXAM: PORTABLE CHEST 1 VIEW COMPARISON:  03/17/2021 FINDINGS: Endotracheal tube is 5 cm above the carina. Heart is normal size. Vascular congestion. Bibasilar atelectasis. No pneumothorax or visible effusions. Postoperative changes from posterior fusion in the midthoracic spine. IMPRESSION: Vascular congestion, bibasilar atelectasis. No pneumothorax. Electronically Signed   By: Charlett Nose M.D.   On: 03/18/2021 20:20   DG C-Arm 1-60 Min-No Report  Result Date: 03/18/2021 Fluoroscopy was utilized by the requesting physician.  No radiographic interpretation.   DG C-Arm 1-60 Min-No Report  Result Date: 03/18/2021 Fluoroscopy was utilized by the requesting physician.  No radiographic interpretation.   DG C-Arm 1-60 Min-No Report  Result Date: 03/18/2021 Fluoroscopy was utilized by the requesting physician.  No radiographic interpretation.   DG C-Arm 1-60 Min-No Report  Result Date: 03/18/2021 Fluoroscopy was utilized by the requesting physician.  No radiographic interpretation.   DG C-Arm 1-60 Min-No Report  Result Date: 03/18/2021 Fluoroscopy was utilized by the requesting physician.  No radiographic interpretation.   DG C-Arm 1-60 Min-No Report  Result Date: 03/18/2021 Fluoroscopy was utilized by the requesting physician.  No radiographic interpretation.    Assessment/Plan: S/p T5-7 and T10-L2 posterior fixation for burst fractures - possible extubation tomorrow -  PT/OT, mobilize OOB - ok to resume pharmacologic DVT prophylaxis, 40 qd today, and bid tomorrow   Sean Soto 03/19/2021, 8:12 PM

## 2021-03-19 NOTE — Progress Notes (Signed)
RT unable to obtain ABG currently due to pt becoming more alert and agitated. RT will attempt again when pt is more stable.

## 2021-03-20 LAB — CBC
HCT: 35.1 % — ABNORMAL LOW (ref 39.0–52.0)
Hemoglobin: 11.5 g/dL — ABNORMAL LOW (ref 13.0–17.0)
MCH: 29.7 pg (ref 26.0–34.0)
MCHC: 32.8 g/dL (ref 30.0–36.0)
MCV: 90.7 fL (ref 80.0–100.0)
Platelets: 208 10*3/uL (ref 150–400)
RBC: 3.87 MIL/uL — ABNORMAL LOW (ref 4.22–5.81)
RDW: 13 % (ref 11.5–15.5)
WBC: 8.2 10*3/uL (ref 4.0–10.5)
nRBC: 0 % (ref 0.0–0.2)

## 2021-03-20 LAB — BASIC METABOLIC PANEL
Anion gap: 10 (ref 5–15)
BUN: 13 mg/dL (ref 6–20)
CO2: 29 mmol/L (ref 22–32)
Calcium: 8.2 mg/dL — ABNORMAL LOW (ref 8.9–10.3)
Chloride: 97 mmol/L — ABNORMAL LOW (ref 98–111)
Creatinine, Ser: 0.91 mg/dL (ref 0.61–1.24)
GFR, Estimated: >60 mL/min (ref >60)
Glucose, Bld: 107 mg/dL — ABNORMAL HIGH (ref 70–99)
Potassium: 3.6 mmol/L (ref 3.5–5.1)
Sodium: 136 mmol/L (ref 135–145)

## 2021-03-20 MED ORDER — POLYVINYL ALCOHOL 1.4 % OP SOLN
1.0000 [drp] | OPHTHALMIC | Status: DC | PRN
  Administered 2021-03-20: 1 [drp] via OPHTHALMIC
  Filled 2021-03-20: qty 15

## 2021-03-20 MED ORDER — DOCUSATE SODIUM 100 MG PO CAPS
100.0000 mg | ORAL_CAPSULE | Freq: Two times a day (BID) | ORAL | Status: DC
  Administered 2021-03-20: 100 mg via ORAL
  Filled 2021-03-20: qty 1

## 2021-03-20 MED ORDER — POLYETHYLENE GLYCOL 3350 17 G PO PACK
17.0000 g | PACK | Freq: Every day | ORAL | Status: DC
  Administered 2021-03-21: 17 g via ORAL
  Filled 2021-03-20 (×2): qty 1

## 2021-03-20 MED ORDER — ORAL CARE MOUTH RINSE
15.0000 mL | Freq: Two times a day (BID) | OROMUCOSAL | Status: DC
  Administered 2021-03-20 (×2): 15 mL via OROMUCOSAL

## 2021-03-20 NOTE — Progress Notes (Signed)
Orthopedic Tech Progress Note Patient Details:  Sean Soto January 19, 1986 [id number] Applied shoulder immobilizer to RUE. Ortho Devices Type of Ortho Device: Shoulder immobilizer Ortho Device/Splint Location: RUE Ortho Device/Splint Interventions: Ordered, Application, Adjustment   Post Interventions Patient Tolerated: Well Instructions Provided: Adjustment of device  Kerry Fort 03/20/2021, 5:08 PM

## 2021-03-20 NOTE — Procedures (Signed)
Extubation Procedure Note  Patient Details:   Name: Sean Soto DOB: 1985/08/12 MRN: [id number]   Airway Documentation:    Vent end date: 03/20/21 Vent end time: 0900   Evaluation  O2 sats: stable throughout Complications: No apparent complications Patient did tolerate procedure well. Bilateral Breath Sounds: Clear, Diminished   Yes  Pt extubated per MD order. Put on 4L [place]. Positive cuff leak noted, no stridor heard. Vitals stable throughout. RT will continue to monitor.   [name] 03/20/2021, 9:05 AM

## 2021-03-20 NOTE — Progress Notes (Signed)
Notified that patient has had sustained tachycardia in the 130s this afternoon. Labs from this morning are unremarkable. EKG reviewed and shows sinus tachycardia. Patient is mildly hypertensive and vitals are otherwise unremarkable. In absence of signs of distress, continue to monitor closely.

## 2021-03-20 NOTE — Progress Notes (Signed)
Subjective: Pt extubated this morning.  He reports his back pain is significantly improved compared with prior to surgery.  Objective: Vital signs in last 24 hours: Temp:  [98.1 F (36.7 C)-100.7 F (38.2 C)] 98.1 F (36.7 C) (10/27 0800) Pulse Rate:  [83-121] 103 (10/27 1000) Resp:  [14-27] 18 (10/27 1000) BP: (83-137)/(53-89) 113/79 (10/27 0400) SpO2:  [91 %-100 %] 95 % (10/27 1000) FiO2 (%):  [40 %-60 %] 40 % (10/27 0800)  Intake/Output from previous day: 10/26 0701 - 10/27 0700 In: 2540.5 [I.V.:2430.2; IV Piggyback:110.3] Out: 3435 [Urine:3435] Intake/Output this shift: Total I/O In: 65.8 [I.V.:65.8] Out: -   Awake, alert NAD Upper strength 5/5 LE full, except 4/5 DF, 4+/5 PF   Lab Results: Recent Labs    03/19/21 0458 03/20/21 0424  WBC 8.3 8.2  HGB 12.2* 11.5*  HCT 37.4* 35.1*  PLT 208 208   BMET Recent Labs    03/19/21 1711 03/20/21 0424  NA 137 136  K 3.3* 3.6  CL 99 97*  CO2 32 29  GLUCOSE 116* 107*  BUN 10 13  CREATININE 0.90 0.91  CALCIUM 8.0* 8.2*    Studies/Results: DG THORACOLUMABAR SPINE  Result Date: 03/18/2021 CLINICAL DATA:  Surgery thoracic spine EXAM: THORACOLUMBAR SPINE 1V COMPARISON:  MRI 03/16/2021 FINDINGS: Seven low resolution intraoperative spot views of the thoracic spine. Total fluoroscopy time was 2 minutes. Slightly limited visibility secondary to multiple artifacts. Surgical instrumentation of the thoracic spine.Limited landmarks due to small field of view IMPRESSION: Intraoperative fluoroscopic assistance provided during thoracolumbar spine surgery Electronically Signed   By: Jasmine Pang M.D.   On: 03/18/2021 21:05   DG Chest Port 1 View  Result Date: 03/19/2021 CLINICAL DATA:  Respiratory failure. EXAM: PORTABLE CHEST 1 VIEW COMPARISON:  Yesterday FINDINGS: Endotracheal tube with tip just below the clavicular heads. Low volume chest with hazy and streaky density bilaterally. Mildly improved aeration with better seen  left diaphragm. No visible effusion or pneumothorax. Normal heart size. IMPRESSION: Mildly improved aeration. Unremarkable endotracheal tube positioning. Electronically Signed   By: Tiburcio Pea M.D.   On: 03/19/2021 09:38   DG Chest Port 1 View  Result Date: 03/18/2021 CLINICAL DATA:  Pneumothorax EXAM: PORTABLE CHEST 1 VIEW COMPARISON:  03/17/2021 FINDINGS: Endotracheal tube is 5 cm above the carina. Heart is normal size. Vascular congestion. Bibasilar atelectasis. No pneumothorax or visible effusions. Postoperative changes from posterior fusion in the midthoracic spine. IMPRESSION: Vascular congestion, bibasilar atelectasis. No pneumothorax. Electronically Signed   By: Charlett Nose M.D.   On: 03/18/2021 20:20   DG C-Arm 1-60 Min-No Report  Result Date: 03/18/2021 Fluoroscopy was utilized by the requesting physician.  No radiographic interpretation.   DG C-Arm 1-60 Min-No Report  Result Date: 03/18/2021 Fluoroscopy was utilized by the requesting physician.  No radiographic interpretation.   DG C-Arm 1-60 Min-No Report  Result Date: 03/18/2021 Fluoroscopy was utilized by the requesting physician.  No radiographic interpretation.   DG C-Arm 1-60 Min-No Report  Result Date: 03/18/2021 Fluoroscopy was utilized by the requesting physician.  No radiographic interpretation.   DG C-Arm 1-60 Min-No Report  Result Date: 03/18/2021 Fluoroscopy was utilized by the requesting physician.  No radiographic interpretation.   DG C-Arm 1-60 Min-No Report  Result Date: 03/18/2021 Fluoroscopy was utilized by the requesting physician.  No radiographic interpretation.    Assessment/Plan: S/p posterior stabilization of burst fractures - OOB with TLSO brace - ok for pharmacologic DVT prophylaxis   [name] 03/20/2021, 10:25 AM

## 2021-03-20 NOTE — Progress Notes (Addendum)
   03/20/21 1433  Vitals  BP (!) 142/74  MAP (mmHg) 89  Pulse Rate (!) 137  ECG Heart Rate (!) 137  Resp (!) 24  Oxygen Therapy  SpO2 96 %   Paged Dr. Freida Busman regarding sustained HR in 130s.  1452- spoke with Tresa Endo NP who recommended watching due to patient having been upset by phone calls.  1530- Dr. Freida Busman gave VO for 12-lead. Completed and in chart.

## 2021-03-20 NOTE — Progress Notes (Signed)
Physical Therapy Treatment Patient Details Name: Sean Soto MRN: [id number] DOB: [birth year] Today's Date: 03/20/2021   History of Present Illness 35 y.o. male presents to [facility] hospital on [facility]/15/2021 after [facility]. Pt found to have T1 sp fx, T4 compression fx, T5 incomplete burst fx, T12 burst fx, T3-5 paravertebral hematoma, L1 R TP fx, R humeral head fx, R occult PTX, R rib 5 fx. Pt underwent T10-L2 fusion and T5-7 posterior percutaneous fixation on 03/18/2021. PMH is unremarkable.    PT Comments    Patient now extubated and on 3L O2 [place]. VSS stable throughout mobility. HR max 130 during session. Patient requires min-modA+2 for sit to stand and stand pivot transfer. Patient able to perform standing marching with single and face to face UE support with minA for balance. Patient at times impulsive requiring cues to wait for therapist. Motivated to get stronger and more mobile. Educated patient on back precautions and R UE restrictions due to patient stating memory deficits from previous session. Continue to recommend comprehensive inpatient rehab (CIR) for post-acute therapy needs.     Recommendations for follow up therapy are one component of a multi-disciplinary discharge planning process, led by the attending physician.  Recommendations may be updated based on patient status, additional functional criteria and insurance authorization.  Follow Up Recommendations  Acute inpatient rehab (3hours/day)     Assistance Recommended at Discharge Intermittent Supervision/Assistance  Equipment Recommendations  Other (comment) (TBD)    Recommendations for Other Services       Precautions / Restrictions Precautions Precautions: Fall;Back Precaution Booklet Issued: No Precaution Comments: verbally reviewed back precautions. Educated on R UE no abduction and ER Required Braces or Orthoses: Spinal Brace;Sling (no sling in room) Spinal Brace: Thoracolumbosacral orthotic;Applied in sitting  position Spinal Brace Comments: on when OOB Restrictions Weight Bearing Restrictions: Yes RUE Weight Bearing: Weight bearing as tolerated LLE Weight Bearing: Weight bearing as tolerated     Mobility  Bed Mobility Overal bed mobility: Needs Assistance Bed Mobility: Rolling;Sidelying to Sit Rolling: Min assist Sidelying to sit: Mod assist;+2 for safety/equipment       General bed mobility comments: minA to roll towards L. ModA+2 for trunk elevation and repositioning hips towards EOB    Transfers Overall transfer level: Needs assistance Equipment used: 2 person hand held assist Transfers: Sit to/from [employer] Sit to Stand: Mod assist;+2 physical assistance;+2 safety/equipment Stand pivot transfers: Min assist;+2 physical assistance;+2 safety/equipment         General transfer comment: modA+2 to power up into full standing from EOB. MinA+2 to stand from recliner. Able to take pivotal steps towards recliner with minA+2.    Ambulation/Gait                 Stairs             Wheelchair Mobility    Modified Rankin (Stroke Patients Only)       Balance Overall balance assessment: Needs assistance Sitting-balance support: No upper extremity supported;Feet supported Sitting balance-Leahy Scale: Fair     Standing balance support: Single extremity supported Standing balance-Leahy Scale: Fair Standing balance comment: able to statically stand with single UE support with min guard-minA                            Cognition Arousal/Alertness: Awake/alert Behavior During Therapy: WFL for tasks assessed/performed;Restless Overall Cognitive Status: Within Functional Limits for tasks assessed  General Comments: restless, requires cues to wait for therapist        Exercises General Exercises - Lower Extremity Hip Flexion/Marching: Both;10 reps;Standing    General Comments         Pertinent Vitals/Pain Pain Assessment: Faces Faces Pain Scale: Hurts little more Pain Location: LLE and back Pain Descriptors / Indicators: Discomfort;Grimacing;Operative site guarding;Shooting;Tingling Pain Intervention(s): Monitored during session;Repositioned    Home Living                          Prior Function            PT Goals (current goals can now be found in the care plan section) Acute Rehab PT Goals Patient Stated Goal: to return to independence PT Goal Formulation: With patient Time For Goal Achievement: 04/02/21 Potential to Achieve Goals: Good Progress towards PT goals: Progressing toward goals    Frequency    Min 5X/week      PT Plan Current plan remains appropriate    Co-evaluation              AM-PAC PT "6 Clicks" Mobility   Outcome Measure  Help needed turning from your back to your side while in a flat bed without using bedrails?: A Little Help needed moving from lying on your back to sitting on the side of a flat bed without using bedrails?: A Lot Help needed moving to and from a bed to a chair (including a wheelchair)?: Total Help needed standing up from a chair using your arms (e.g., wheelchair or bedside chair)?: Total Help needed to walk in hospital room?: Total Help needed climbing 3-5 steps with a railing? : Total 6 Click Score: 9    End of Session Equipment Utilized During Treatment: Back brace Activity Tolerance: Patient tolerated treatment well Patient left: in chair;with call bell/phone within reach;with chair alarm set Nurse Communication: Mobility status PT Visit Diagnosis: Other abnormalities of gait and mobility (R26.89);Muscle weakness (generalized) (M62.81);Pain     Time: 6203-5597 PT Time Calculation (min) (ACUTE ONLY): 33 min  Charges:  $Therapeutic Activity: 23-37 mins                     Charnese Federici A. Dan Humphreys PT, DPT Acute Rehabilitation Services Pager [phone] Office  [phone]    Viviann Spare 03/20/2021, 4:53 PM

## 2021-03-21 ENCOUNTER — Inpatient Hospital Stay (HOSPITAL_COMMUNITY): Payer: Self-pay

## 2021-03-21 ENCOUNTER — Other Ambulatory Visit (HOSPITAL_COMMUNITY): Payer: Self-pay

## 2021-03-21 ENCOUNTER — Encounter (HOSPITAL_COMMUNITY): Payer: Self-pay | Admitting: Neurosurgery

## 2021-03-21 LAB — CBC
HCT: 34.8 % — ABNORMAL LOW (ref 39.0–52.0)
Hemoglobin: 11.6 g/dL — ABNORMAL LOW (ref 13.0–17.0)
MCH: 29.5 pg (ref 26.0–34.0)
MCHC: 33.3 g/dL (ref 30.0–36.0)
MCV: 88.5 fL (ref 80.0–100.0)
Platelets: 230 10*3/uL (ref 150–400)
RBC: 3.93 MIL/uL — ABNORMAL LOW (ref 4.22–5.81)
RDW: 12.7 % (ref 11.5–15.5)
WBC: 8.3 10*3/uL (ref 4.0–10.5)
nRBC: 0 % (ref 0.0–0.2)

## 2021-03-21 LAB — BASIC METABOLIC PANEL
Anion gap: 8 (ref 5–15)
BUN: 12 mg/dL (ref 6–20)
CO2: 26 mmol/L (ref 22–32)
Calcium: 8.6 mg/dL — ABNORMAL LOW (ref 8.9–10.3)
Chloride: 103 mmol/L (ref 98–111)
Creatinine, Ser: 0.77 mg/dL (ref 0.61–1.24)
GFR, Estimated: >60 mL/min (ref >60)
Glucose, Bld: 147 mg/dL — ABNORMAL HIGH (ref 70–99)
Potassium: 3.2 mmol/L — ABNORMAL LOW (ref 3.5–5.1)
Sodium: 137 mmol/L (ref 135–145)

## 2021-03-21 MED ORDER — METHOCARBAMOL 500 MG PO TABS
1000.0000 mg | ORAL_TABLET | Freq: Three times a day (TID) | ORAL | 0 refills | Status: AC | PRN
  Filled 2021-03-21: qty 50, 9d supply, fill #0

## 2021-03-21 MED ORDER — ACETAMINOPHEN 500 MG PO TABS: 1000.0000 mg | ORAL_TABLET | Freq: Four times a day (QID) | ORAL | 0 refills | Status: AC | PRN

## 2021-03-21 MED ORDER — GABAPENTIN 300 MG PO CAPS
300.0000 mg | ORAL_CAPSULE | Freq: Three times a day (TID) | ORAL | 0 refills | Status: AC | PRN
  Filled 2021-03-21: qty 40, 14d supply, fill #0

## 2021-03-21 MED ORDER — OXYCODONE HCL 10 MG PO TABS
10.0000 mg | ORAL_TABLET | ORAL | 0 refills | Status: AC | PRN
  Filled 2021-03-21: qty 30, 4d supply, fill #0

## 2021-03-21 MED ORDER — IBUPROFEN 200 MG PO TABS: 600.0000 mg | ORAL_TABLET | Freq: Three times a day (TID) | ORAL | 2 refills | Status: AC | PRN

## 2021-03-21 NOTE — Progress Notes (Signed)
Physical Therapy Treatment Patient Details Name: Sean Soto MRN: [id number] DOB: [birth year] Today's Date: 03/21/2021   History of Present Illness 35 y.o. male presents to [facility] hospital on 03/15/2021 after [facility]. Pt found to have T1 sp fx, T4 compression fx, T5 incomplete burst fx, T12 burst fx, T3-5 paravertebral hematoma, L1 R TP fx, R humeral head fx, R occult PTX, R rib 5 fx. Pt underwent T10-L2 fusion and T5-7 posterior percutaneous fixation on 03/18/2021. ETT 10/25-10/27. PMH is unremarkable.    PT Comments    Patient progressing towards physical therapy goals. Patient requires close supervision for ambulation with no AD due to unsteadiness and LOB x 1-2. Able to negotiate 3 stairs with min guard and no hand rail. Reinforced back precautions with mobility and tasks required at home. Patient able to don back brace in sitting. Encouraged patient to have someone present during mobility for safety. Updated recommendation to HHPT to maximize functional independence and reinforce precautions in the home setting.    Recommendations for follow up therapy are one component of a multi-disciplinary discharge planning process, led by the attending physician.  Recommendations may be updated based on patient status, additional functional criteria and insurance authorization.  Follow Up Recommendations  Home health PT     Assistance Recommended at Discharge Intermittent Supervision/Assistance  Equipment Recommendations  None recommended by PT    Recommendations for Other Services       Precautions / Restrictions Precautions Precautions: Fall;Back Precaution Booklet Issued: No Precaution Comments: verbally reviewed precautions. Unable to recall R UE restrictions Required Braces or Orthoses: Spinal Brace;Sling Spinal Brace: Thoracolumbosacral orthotic;Applied in sitting position Spinal Brace Comments: on when OOB Restrictions Weight Bearing Restrictions: Yes RUE Weight Bearing: Weight bearing  as tolerated LLE Weight Bearing: Weight bearing as tolerated Other Position/Activity Restrictions: no abduction or external rotation of RUE     Mobility  Bed Mobility Overal bed mobility: Needs Assistance Bed Mobility: Rolling;Sidelying to Sit Rolling: Supervision Sidelying to sit: Supervision       General bed mobility comments: supervision for safety. Able to recall log roll technique    Transfers Overall transfer level: Needs assistance Equipment used: None Transfers: Sit to/from Stand Sit to Stand: Supervision           General transfer comment: supervision for safety from low bed surface.    Ambulation/Gait Ambulation/Gait assistance: Supervision Gait Distance (Feet): 200 Feet Assistive device: None Gait Pattern/deviations: Step-through pattern;Decreased stride length;Drifts right/left Gait velocity: decreased   General Gait Details: close supervision for safety due to unsteadiness with turning. Complains of L foot pain during mobility. Patient with LOB x1-2 during ambulation but able to self correct.   Stairs Stairs: Yes Stairs assistance: Min guard Stair Management: No rails;Alternating pattern;Forwards Number of Stairs: 3 General stair comments: min guard for safety. Unsteady and scissoring with stair negotiation   Wheelchair Mobility    Modified Rankin (Stroke Patients Only)       Balance Overall balance assessment: Mild deficits observed, not formally tested                                          Cognition Arousal/Alertness: Awake/alert Behavior During Therapy: WFL for tasks assessed/performed Overall Cognitive Status: Within Functional Limits for tasks assessed  General Comments: predict patient at baseline cognitively. Hyperverbose throughout session        Exercises      General Comments        Pertinent Vitals/Pain Pain Assessment: Faces Faces Pain Scale: Hurts  little more Pain Location: LLE Pain Descriptors / Indicators: Discomfort;Grimacing;Operative site guarding;Shooting;Tingling;Burning Pain Intervention(s): Monitored during session;Repositioned    Home Living                          Prior Function            PT Goals (current goals can now be found in the care plan section) Acute Rehab PT Goals Patient Stated Goal: to return to independence PT Goal Formulation: With patient Time For Goal Achievement: 04/02/21 Potential to Achieve Goals: Good Progress towards PT goals: Progressing toward goals    Frequency    Min 5X/week      PT Plan Current plan remains appropriate    Co-evaluation PT/OT/SLP Co-Evaluation/Treatment: Yes Reason for Co-Treatment: For patient/therapist safety;To address functional/ADL transfers PT goals addressed during session: Mobility/safety with mobility;Balance        AM-PAC PT "6 Clicks" Mobility   Outcome Measure  Help needed turning from your back to your side while in a flat bed without using bedrails?: A Little Help needed moving from lying on your back to sitting on the side of a flat bed without using bedrails?: A Little Help needed moving to and from a bed to a chair (including a wheelchair)?: A Little Help needed standing up from a chair using your arms (e.g., wheelchair or bedside chair)?: A Little Help needed to walk in hospital room?: A Little Help needed climbing 3-5 steps with a railing? : A Little 6 Click Score: 18    End of Session Equipment Utilized During Treatment: Back brace Activity Tolerance: Patient tolerated treatment well Patient left: in chair;with call bell/phone within reach;with chair alarm set Nurse Communication: Mobility status PT Visit Diagnosis: Other abnormalities of gait and mobility (R26.89);Muscle weakness (generalized) (M62.81);Pain Pain - Right/Left: Left Pain - part of body: Ankle and joints of foot     Time: 0825-0903 PT Time  Calculation (min) (ACUTE ONLY): 38 min  Charges:  $Gait Training: 23-37 mins                     Graceanne Guin A. Dan Humphreys PT, DPT Acute Rehabilitation Services Pager [phone] Office [phone]    Viviann Spare 03/21/2021, 10:20 AM

## 2021-03-21 NOTE — Progress Notes (Signed)
Trauma/Critical Care Follow Up Note  Subjective:    Overnight Issues:   Objective:  Vital signs for last 24 hours: Temp:  [98.1 F (36.7 C)-98.7 F (37.1 C)] 98.7 F (37.1 C) (10/28 0400) Pulse Rate:  [103-138] 107 (10/28 0430) Resp:  [15-30] 15 (10/28 0800) BP: (142-159)/(74-93) 148/92 (10/28 0400) SpO2:  [67 %-98 %] 67 % (10/28 0430)  Hemodynamic parameters for last 24 hours:    Intake/Output from previous day: 10/27 0701 - 10/28 0700 In: 65.8 [I.V.:65.8] Out: 500 [Urine:500]  Intake/Output this shift: No intake/output data recorded.  Vent settings for last 24 hours:    Physical Exam:  Gen: comfortable, no distress Neuro: non-focal exam HEENT: PERRL Neck: supple CV: RRR Pulm: unlabored breathing Abd: soft, NT GU: clear yellow urine Extr: wwp, no edema   Results for orders placed or performed during the hospital encounter of 03/15/21 (from the past 24 hour(s))  CBC     Status: Abnormal   Collection Time: 03/21/21  3:39 AM  Result Value Ref Range   WBC 8.3 4.0 - 10.5 K/uL   RBC 3.93 (L) 4.22 - 5.81 MIL/uL   Hemoglobin 11.6 (L) 13.0 - 17.0 g/dL   HCT 24.0 (L) 97.3 - 53.2 %   MCV 88.5 80.0 - 100.0 fL   MCH 29.5 26.0 - 34.0 pg   MCHC 33.3 30.0 - 36.0 g/dL   RDW 99.2 42.6 - 83.4 %   Platelets 230 150 - 400 K/uL   nRBC 0.0 0.0 - 0.2 %  Basic metabolic panel     Status: Abnormal   Collection Time: 03/21/21  3:39 AM  Result Value Ref Range   Sodium 137 135 - 145 mmol/L   Potassium 3.2 (L) 3.5 - 5.1 mmol/L   Chloride 103 98 - 111 mmol/L   CO2 26 22 - 32 mmol/L   Glucose, Bld 147 (H) 70 - 99 mg/dL   BUN 12 6 - 20 mg/dL   Creatinine, Ser 1.96 0.61 - 1.24 mg/dL   Calcium 8.6 (L) 8.9 - 10.3 mg/dL   GFR, Estimated >22 >29 mL/min   Anion gap 8 5 - 15    Assessment & Plan: The plan of care was discussed with the bedside nurse for the day, [name], who is in agreement with this plan and no additional concerns were raised.   Present on Admission:  Thoracic  spine fracture (HCC)    LOS: 6 days   Additional comments:I reviewed the patient's new clinical lab test results.   and I reviewed the patients new imaging test results.    [facility]   Multiple back fractures inclduing T1 SP fx, T5 superior endplace compression fx, T6 burst fx, L1 burst fx w/ partial effacement of the CSF surroudning the distal thoracic cord - Per NSGY, Dr. [name]. S/p fixation 10/25. Okay to shower-with brace if standing, without brace if seated L1 R TP fx - pain control R humeral head fx - Per Ortho, Dr. [name], non-op management, avoid abduction and external rotation. Sling for comfort. PT/OT. Follow up outpatient.  R occult PTX, question L occult PTX - repeat CXR without PTX, IS/pulm toilet Pulmonary edema - 2/2 intraop volume admin and prolonged prone positioning, diuresed post-op again today VDRF - tol PSV, but too much support to extubate today, diurese and plan for extubation in AM R 5th rib fx - IS/pulm toilet L lateral lower leg pain - Xray and CT negative for fracture. Per Ortho. Suspect at least irritation of the  superficial peroneal nerve. ? If related to back fx's/canal stenosis. On gabapentin. Dr. [name] discussed with Rads. Plan for stress films today.  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 Outpatient follow up. Radiology recommending MRI with and without contrast for further evaluation. FEN - NPO, cortrak DVT - SCDs, Lovenox BID Dispo - TTF, possibly home today based on therapy re-eval and XRs  [name], MD Trauma & General Surgery Please use [email or web address] to contact on call provider  03/21/2021  *Care during the described time interval was provided by me. I have reviewed this patient's available data, including medical history, events of note, physical examination and test results as part of my evaluation.

## 2021-03-21 NOTE — Progress Notes (Signed)
Occupational Therapy Treatment Patient Details Name: Sean Soto MRN: [id number] DOB: 10-15-1985 Today's Date: 03/21/2021   History of present illness 35 y.o. male presents to [facility] hospital on 03/15/2021 after [facility]. Pt found to have T1 sp fx, T4 compression fx, T5 incomplete burst fx, T12 burst fx, T3-5 paravertebral hematoma, L1 R TP fx, R humeral head fx, R occult PTX, R rib 5 fx. Pt underwent T10-L2 fusion and T5-7 posterior percutaneous fixation on 03/18/2021. ETT 10/25-10/27. PMH is unremarkable.   OT comments  Pt demonstrates signfiicant improvements.  He is able to perform ADLs with min guard assist, but requires min cues for safety and precautions.  Sp02 86% with activity.  Disposition changed to home.    Recommendations for follow up therapy are one component of a multi-disciplinary discharge planning process, led by the attending physician.  Recommendations may be updated based on patient status, additional functional criteria and insurance authorization.    Follow Up Recommendations  No OT follow up    Assistance Recommended at Discharge Frequent or constant Supervision/Assistance (initially then tapering to intermittent)  Equipment Recommendations  BSC;Tub/shower seat    Recommendations for Other Services      Precautions / Restrictions Precautions Precautions: Fall;Back Precaution Booklet Issued: No Precaution Comments: verbally reviewed precautions. Unable to recall R UE restrictions Required Braces or Orthoses: Spinal Brace;Sling Spinal Brace: Thoracolumbosacral orthotic;Applied in sitting position Spinal Brace Comments: on when OOB Restrictions Weight Bearing Restrictions: Yes RUE Weight Bearing: Weight bearing as tolerated Other Position/Activity Restrictions: no abduction or external rotation of RUE       Mobility Bed Mobility Overal bed mobility: Needs Assistance Bed Mobility: Rolling;Sidelying to Sit Rolling: Supervision Sidelying to sit: Supervision        General bed mobility comments: supervision for safety. Able to recall log roll technique    Transfers Overall transfer level: Needs assistance Equipment used: None Transfers: Sit to/from [employer] Sit to Stand: Supervision Stand pivot transfers: Min guard               Balance Overall balance assessment: Mild deficits observed, not formally tested                                         ADL either performed or assessed with clinical judgement   ADL Overall ADL's : Needs assistance/impaired Eating/Feeding: NPO   Grooming: Wash/dry hands;Wash/dry face;Oral care;Min guard;Standing Grooming Details (indicate cue type and reason): pt instructed on safe technique for oral care Upper Body Bathing: Set up;Supervision/ safety;Sitting   Lower Body Bathing: Min guard;Sit to/from stand Lower Body Bathing Details (indicate cue type and reason): pt able to perform figure four Upper Body Dressing : Set up;Supervision/safety;Sitting   Lower Body Dressing: Min guard;Sit to/from stand Lower Body Dressing Details (indicate cue type and reason): able to don figure 4 Toilet Transfer: Min guard;Ambulation;Comfort height toilet;BSC   Toileting- Clothing Manipulation and Hygiene: Maximal assistance;Sit to/from stand       Functional mobility during ADLs: Min guard General ADL Comments: pt able to don brace with supervision and min cues.  Pt instructed on back precautions and how to safely perform ADLs     Vision       Perception     Praxis      Cognition Arousal/Alertness: Awake/alert Behavior During Therapy: WFL for tasks assessed/performed Overall Cognitive Status: Within Functional Limits for tasks assessed  Exercises     Shoulder Instructions       General Comments Sp02 to 86% with activity on RA.  He required ~ 2 mins recover to return to 92%    Pertinent Vitals/  Pain       Pain Assessment: Faces Faces Pain Scale: Hurts little more Pain Location: LLE Pain Descriptors / Indicators: Discomfort;Grimacing;Operative site guarding;Shooting;Tingling;Burning Pain Intervention(s): Monitored during session  Home Living                                          Prior Functioning/Environment              Frequency  Min 2X/week        Progress Toward Goals  OT Goals(current goals can now be found in the care plan section)  Progress towards OT goals: Progressing toward goals     Plan Discharge plan needs to be updated    Co-evaluation      Reason for Co-Treatment: For patient/therapist safety;To address functional/ADL transfers   OT goals addressed during session: ADL's and self-care      AM-PAC OT "6 Clicks" Daily Activity     Outcome Measure   Help from another person eating meals?: None Help from another person taking care of personal grooming?: A Little Help from another person toileting, which includes using toliet, bedpan, or urinal?: A Little Help from another person bathing (including washing, rinsing, drying)?: A Little Help from another person to put on and taking off regular upper body clothing?: A Little Help from another person to put on and taking off regular lower body clothing?: A Little 6 Click Score: 19    End of Session Equipment Utilized During Treatment: Back brace;Other (comment)  OT Visit Diagnosis: Unsteadiness on feet (R26.81);Other abnormalities of gait and mobility (R26.89);Pain Pain - Right/Left: Left Pain - part of body: Leg   Activity Tolerance Patient tolerated treatment well   Patient Left in chair;with call bell/phone within reach;with chair alarm set;with nursing/sitter in room   Nurse Communication Mobility status        Time: 0825-0903 OT Time Calculation (min): 38 min  Charges: OT General Charges $OT Visit: 1 Visit OT Treatments $Self Care/Home Management : 8-22  mins  Eber Jones OTR/L Acute Rehabilitation Services Pager [phone] Office [phone]   Jeani Hawking M 03/21/2021, 2:56 PM

## 2021-03-21 NOTE — Progress Notes (Signed)
Inpatient Rehabilitation Admissions Coordinator   Patient has progressed with therapy is no longer in need of an acute inpatient rehab admit. We will not pursue CIR.  Ottie Glazier, RN, MSN Rehab Admissions Coordinator [phone] 03/21/2021 10:35 AM

## 2021-03-21 NOTE — TOC Transition Note (Signed)
Transition of Care [facility]) - CM/SW Discharge Note   Patient Details  Name: Sean Soto MRN: [id number] Date of Birth: [birth year]  [facility] CM/SW Contact:  Glennon Mac, RN Phone Number: 03/21/2021, 3:30 PM   Clinical Narrative:    35 y.o. male presents to [facility] hospital on 03/15/2021 after [facility]. Pt found to have T1 sp fx, T4 compression fx, T5 incomplete burst fx, T12 burst fx, T3-5 paravertebral hematoma, L1 R TP fx, R humeral head fx, R occult PTX, R rib 5 fx. Pt underwent T10-L2 fusion and T5-7 posterior percutaneous fixation on 03/18/2021.  PTA, pt independent and living at home with significant other.  PT recommending HH, but pt is uninsured and does not qualify for [facility] services. Pt agreeable to OP follow up at [facility] for continued therapy.  Referral to [employer] for recommended DME; 3 in 1 and tub bench, to be delivred to bedside prior to dc.      Final next level of care: OP Rehab Barriers to Discharge: Barriers Resolved   Patient Goals and CMS Choice Patient states their goals for this hospitalization and ongoing recovery are:: to go home                            Discharge Plan and Services   Discharge Planning Services: CM Consult            DME Arranged: 3-N-1, Tub bench DME Agency: [employer] Date DME Agency Contacted: 03/21/21 Time DME Agency Contacted: 1530 Representative spoke with at DME Agency: Velna Hatchet            Social Determinants of Health (SDOH) Interventions     Readmission Risk Interventions No flowsheet data found.  Quintella Baton, RN, BSN  Trauma/Neuro ICU Case Manager [phone]

## 2021-03-21 NOTE — Discharge Instructions (Addendum)
For your right humeral head fracture - please follow up with Orthopedics. Avoid abduction and external rotation. You can wear a sling for comfort.   Please use back brace when out of bed.   RIB FRACTURES  HOME INSTRUCTIONS   PAIN CONTROL:  Pain is best controlled by a usual combination of three different methods TOGETHER:  Ice/Heat Over the counter pain medication Prescription pain medication You may experience some swelling and bruising in area of broken ribs. Ice packs or heating pads (30-60 minutes up to 6 times a day) will help. Use ice for the first few days to help decrease swelling and bruising, then switch to heat to help relax tight/sore spots and speed recovery. Some people prefer to use ice alone, heat alone, alternating between ice & heat. Experiment to what works for you. Swelling and bruising can take several weeks to resolve.  It is helpful to take an over-the-counter pain medication regularly for the first few weeks. Choose one of the following that works best for you:  Naproxen (Aleve, etc) Two 220mg  tabs twice a day Ibuprofen (Advil, etc) Three 200mg  tabs four times a day (every meal & bedtime) Acetaminophen (Tylenol, etc) 500-650mg  four times a day (every meal & bedtime) A prescription for pain medication (such as oxycodone, hydrocodone, etc) may be given to you upon discharge. Take your pain medication as prescribed.  If you are having problems/concerns with the prescription medicine (does not control pain, nausea, vomiting, rash, itching, etc), please call [phone] to see if we need to switch you to a different pain medicine that will work better for you and/or control your side effect better. If you need a refill on your pain medication, please contact your pharmacy. They will contact our office to request authorization. Prescriptions will not be filled after 5 pm or on week-ends. Avoid getting constipated. When taking pain medications, it is common to experience  some constipation. Increasing fluid intake and taking a fiber supplement (such as Metamucil, Citrucel, FiberCon, MiraLax, etc) 1-2 times a day regularly will usually help prevent this problem from occurring. A mild laxative (prune juice, Milk of Magnesia, MiraLax, etc) should be taken according to package directions if there are no bowel movements after 48 hours.  Watch out for diarrhea. If you have many loose bowel movements, simplify your diet to bland foods & liquids for a few days. Stop any stool softeners and decrease your fiber supplement. Switching to mild anti-diarrheal medications (Kayopectate, Pepto Bismol) can help. If this worsens or does not improve, please call us. FOLLOW UP  If a follow up appointment is needed one will be scheduled for you. If none is needed with our trauma team, please follow up with your primary care provider within 2-3 weeks from discharge. Please call [facility] at [phone] if you have any questions about follow up.  If you have any orthopedic or other injuries you will need to follow up as outlined in your follow up instructions.   WHEN TO CALL us [phone]:  Poor pain control Reactions / problems with new medications (rash/itching, nausea, etc)  Fever over 101.5 F (38.5 C) Worsening swelling or bruising Worsening pain, productive cough, difficulty breathing or any other concerning symptoms  The clinic staff is available to answer your questions during regular business hours (8:30am-5pm). Please don't hesitate to call and ask to speak to one of our nurses for clinical concerns.  If you have a medical emergency, go to the nearest emergency room or  call 911.  A surgeon from [facility] is always on call at the [facility], [place]  [address], [place], [place] [address] ?  MAIN: [phone] ? TOLL FREE: [phone] ?  FAX [phone]  [email or web address]      Information on Rib  Fractures  A rib fracture is a break or crack in one of the bones of the ribs. The ribs are long, curved bones that wrap around your chest and attach to your spine and your breastbone. The ribs protect your heart, lungs, and other organs in the chest. A broken or cracked rib is often painful but is not usually serious. Most rib fractures heal on their own over time. However, rib fractures can be more serious if multiple ribs are broken or if broken ribs move out of place and push against other structures or organs. What are the causes? This condition is caused by: Repetitive movements with high force, such as pitching a baseball or having severe coughing spells. A direct blow to the chest, such as a sports injury, a car accident, or a fall. Cancer that has spread to the bones, which can weaken bones and cause them to break. What are the signs or symptoms? Symptoms of this condition include: Pain when you breathe in or cough. Pain when someone presses on the injured area. Feeling short of breath. How is this diagnosed? This condition is diagnosed with a physical exam and medical history. Imaging tests may also be done, such as: Chest X-ray. CT scan. MRI. Bone scan. Chest ultrasound. How is this treated? Treatment for this condition depends on the severity of the fracture. Most rib fractures usually heal on their own in 1-3 months. Sometimes healing takes longer if there is a cough that does not stop or if there are other activities that make the injury worse (aggravating factors). While you heal, you will be given medicines to control the pain. You will also be taught deep breathing exercises. Severe injuries may require hospitalization or surgery. Follow these instructions at home: Managing pain, stiffness, and swelling If directed, apply ice to the injured area. Put ice in a plastic bag. Place a towel between your skin and the bag. Leave the ice on for 20 minutes, 2-3 times a day. Take  over-the-counter and prescription medicines only as told by your health care provider. Activity Avoid a lot of activity and any activities or movements that cause pain. Be careful during activities and avoid bumping the injured rib. Slowly increase your activity as told by your health care provider. General instructions Do deep breathing exercises as told by your health care provider. This helps prevent pneumonia, which is a common complication of a broken rib. Your health care provider may instruct you to: Take deep breaths several times a day. Try to cough several times a day, holding a pillow against the injured area. Use a device called incentive spirometer to practice deep breathing several times a day. Drink enough fluid to keep your urine pale yellow. Do not wear a rib belt or binder. These restrict breathing, which can lead to pneumonia. Keep all follow-up visits as told by your health care provider. This is important. Contact a health care provider if: You have a fever. Get help right away if: You have difficulty breathing or you are short of breath. You develop a cough that does not stop, or you cough up thick or bloody sputum. You have nausea, vomiting, or  pain in your abdomen. Your pain gets worse and medicine does not help. Summary A rib fracture is a break or crack in one of the bones of the ribs. A broken or cracked rib is often painful but is not usually serious. Most rib fractures heal on their own over time. Treatment for this condition depends on the severity of the fracture. Avoid a lot of activity and any activities or movements that cause pain. This information is not intended to replace advice given to you by your health care provider. Make sure you discuss any questions you have with your health care provider. Document Released: 05/11/2005 Document Revised: 08/10/2016 Document Reviewed: 08/10/2016 Elsevier Interactive Patient Education  [employer].   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 We recommend that you follow up with your PCP for this. Radiology is recommending MRI with and without contrast for further evaluation.
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
## 2021-04-08 ENCOUNTER — Ambulatory Visit (HOSPITAL_COMMUNITY): Payer: 59 | Attending: General Surgery | Admitting: Physical Therapy

## 2021-04-08 ENCOUNTER — Encounter (HOSPITAL_COMMUNITY): Payer: Self-pay | Admitting: Physical Therapy

## 2021-04-08 ENCOUNTER — Other Ambulatory Visit: Payer: Self-pay

## 2021-04-08 DIAGNOSIS — S22001A Stable burst fracture of unspecified thoracic vertebra, initial encounter for closed fracture: Secondary | ICD-10-CM | POA: Insufficient documentation

## 2021-04-08 DIAGNOSIS — M546 Pain in thoracic spine: Secondary | ICD-10-CM | POA: Diagnosis not present

## 2021-04-08 DIAGNOSIS — M545 Low back pain, unspecified: Secondary | ICD-10-CM | POA: Diagnosis not present

## 2021-04-08 NOTE — Patient Instructions (Signed)
Access Code: [email or web address] URL: [email or web address] Date: 04/08/2021 Prepared by: Georges Lynch  Exercises Supine Gluteal Sets - 2-3 x daily - 7 x weekly - 2 sets - 10 reps - 3-5 seconds hold Supine Transversus Abdominis Bracing - Hands on Stomach - 2-3 x daily - 7 x weekly - 2 sets - 10 reps - 3-5 second hold Seated Scapular Retraction - 2-3 x daily - 7 x weekly - 2 sets - 10 reps - 3-5 second hold Seated Cervical Retraction - 2-3 x daily - 7 x weekly - 2 sets - 10 reps - 3-5 second hold
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
## 2021-04-16 ENCOUNTER — Ambulatory Visit (HOSPITAL_COMMUNITY): Payer: 59 | Admitting: Physical Therapy

## 2021-04-16 ENCOUNTER — Encounter (HOSPITAL_COMMUNITY): Payer: Self-pay | Admitting: Physical Therapy

## 2021-04-16 ENCOUNTER — Other Ambulatory Visit: Payer: Self-pay

## 2021-04-16 DIAGNOSIS — M545 Low back pain, unspecified: Secondary | ICD-10-CM

## 2021-04-16 DIAGNOSIS — S22001A Stable burst fracture of unspecified thoracic vertebra, initial encounter for closed fracture: Secondary | ICD-10-CM | POA: Diagnosis not present

## 2021-04-16 DIAGNOSIS — M546 Pain in thoracic spine: Secondary | ICD-10-CM | POA: Diagnosis not present
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
## 2021-04-16 NOTE — Patient Instructions (Signed)
Access Code: [email or web address] URL: [email or web address] Date: 04/16/2021 Prepared by: Georges Lynch  Exercises Standing Shoulder Row with Anchored Resistance - 2-3 x daily - 7 x weekly - 2 sets - 10 reps - 3 second hold Shoulder Extension with Resistance - 2-3 x daily - 7 x weekly - 2 sets - 10 reps - 3 second hold

## 2021-04-22 ENCOUNTER — Encounter (HOSPITAL_COMMUNITY): Payer: Self-pay | Admitting: Physical Therapy

## 2021-04-22 ENCOUNTER — Ambulatory Visit (HOSPITAL_COMMUNITY): Payer: 59 | Admitting: Physical Therapy

## 2021-04-22 ENCOUNTER — Other Ambulatory Visit: Payer: Self-pay

## 2021-04-22 DIAGNOSIS — M546 Pain in thoracic spine: Secondary | ICD-10-CM | POA: Diagnosis not present

## 2021-04-22 DIAGNOSIS — M545 Low back pain, unspecified: Secondary | ICD-10-CM

## 2021-04-22 DIAGNOSIS — S22001A Stable burst fracture of unspecified thoracic vertebra, initial encounter for closed fracture: Secondary | ICD-10-CM | POA: Diagnosis not present
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
## 2021-04-29 ENCOUNTER — Other Ambulatory Visit: Payer: Self-pay

## 2021-04-29 ENCOUNTER — Ambulatory Visit (HOSPITAL_COMMUNITY): Payer: 59 | Attending: General Surgery | Admitting: Physical Therapy

## 2021-04-29 ENCOUNTER — Encounter (HOSPITAL_COMMUNITY): Payer: Self-pay | Admitting: Physical Therapy

## 2021-04-29 DIAGNOSIS — M546 Pain in thoracic spine: Secondary | ICD-10-CM | POA: Insufficient documentation

## 2021-04-29 DIAGNOSIS — M545 Low back pain, unspecified: Secondary | ICD-10-CM | POA: Diagnosis not present
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
## 2021-04-29 NOTE — Patient Instructions (Signed)
Access Code: [email or web address] URL: [email or web address] Date: 04/29/2021 Prepared by: Georges Lynch  Exercises Supine March - 2-3 x daily - 7 x weekly - 2 sets - 10 reps Small Range Straight Leg Raise - 2-3 x daily - 7 x weekly - 2 sets - 10 reps

## 2021-05-06 ENCOUNTER — Ambulatory Visit (HOSPITAL_COMMUNITY): Payer: 59 | Admitting: Physical Therapy

## 2021-05-06 ENCOUNTER — Other Ambulatory Visit: Payer: Self-pay

## 2021-05-06 DIAGNOSIS — M546 Pain in thoracic spine: Secondary | ICD-10-CM

## 2021-05-06 DIAGNOSIS — M545 Low back pain, unspecified: Secondary | ICD-10-CM
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
## 2021-05-06 NOTE — Patient Instructions (Signed)
Access Code: [email or web address] URL: [email or web address] Date: 05/06/2021 Prepared by: Georges Lynch  Exercises Standing Shoulder External Rotation with Resistance - 2-3 x daily - 7 x weekly - 2 sets - 10 reps Standing Shoulder Horizontal Abduction with Resistance - 2-3 x daily - 7 x weekly - 2 sets - 10 reps

## 2021-05-13 ENCOUNTER — Ambulatory Visit (HOSPITAL_COMMUNITY): Payer: 59 | Admitting: Physical Therapy

## 2021-05-13 ENCOUNTER — Other Ambulatory Visit: Payer: Self-pay

## 2021-05-13 ENCOUNTER — Encounter (HOSPITAL_COMMUNITY): Payer: Self-pay | Admitting: Physical Therapy

## 2021-05-13 DIAGNOSIS — M546 Pain in thoracic spine: Secondary | ICD-10-CM

## 2021-05-13 DIAGNOSIS — M545 Low back pain, unspecified: Secondary | ICD-10-CM
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
## 2021-05-13 NOTE — Patient Instructions (Signed)
Access Code: [email or web address] URL: [email or web address] Date: 05/13/2021 Prepared by: Georges Lynch  Exercises Standing Shoulder Diagonal Horizontal Abduction 60/120 Degrees with Resistance - 2-3 x daily - 7 x weekly - 2 sets - 10 reps Wall Push Up - 2-3 x daily - 7 x weekly - 2 sets - 10 reps Corner Pec Minor Stretch - 2-3 x daily - 7 x weekly - 1 sets - 3 reps - 30 second hold

## 2021-05-15 ENCOUNTER — Encounter (HOSPITAL_COMMUNITY): Payer: Self-pay

## 2021-05-15 ENCOUNTER — Ambulatory Visit (HOSPITAL_COMMUNITY): Payer: 59

## 2021-05-15 ENCOUNTER — Other Ambulatory Visit: Payer: Self-pay

## 2021-05-15 DIAGNOSIS — M545 Low back pain, unspecified: Secondary | ICD-10-CM | POA: Diagnosis not present

## 2021-05-15 DIAGNOSIS — M546 Pain in thoracic spine: Secondary | ICD-10-CM
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
## 2021-05-20 ENCOUNTER — Ambulatory Visit (HOSPITAL_COMMUNITY): Payer: 59

## 2021-05-20 ENCOUNTER — Encounter (HOSPITAL_COMMUNITY): Payer: Self-pay

## 2021-05-20 ENCOUNTER — Other Ambulatory Visit: Payer: Self-pay

## 2021-05-20 DIAGNOSIS — M546 Pain in thoracic spine: Secondary | ICD-10-CM | POA: Diagnosis not present

## 2021-05-20 DIAGNOSIS — M545 Low back pain, unspecified: Secondary | ICD-10-CM | POA: Diagnosis not present
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
## 2021-05-22 ENCOUNTER — Ambulatory Visit (HOSPITAL_COMMUNITY): Payer: 59

## 2021-05-22 ENCOUNTER — Other Ambulatory Visit: Payer: Self-pay

## 2021-05-22 ENCOUNTER — Encounter (HOSPITAL_COMMUNITY): Payer: Self-pay

## 2021-05-22 DIAGNOSIS — M545 Low back pain, unspecified: Secondary | ICD-10-CM | POA: Diagnosis not present

## 2021-05-22 DIAGNOSIS — M546 Pain in thoracic spine: Secondary | ICD-10-CM | POA: Diagnosis not present
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
## 2021-05-27 ENCOUNTER — Encounter (HOSPITAL_COMMUNITY): Payer: Self-pay | Admitting: Physical Therapy

## 2021-05-27 ENCOUNTER — Ambulatory Visit (HOSPITAL_COMMUNITY): Payer: 59 | Attending: General Surgery | Admitting: Physical Therapy

## 2021-05-27 ENCOUNTER — Other Ambulatory Visit: Payer: Self-pay

## 2021-05-27 DIAGNOSIS — M545 Low back pain, unspecified: Secondary | ICD-10-CM | POA: Insufficient documentation

## 2021-05-27 DIAGNOSIS — M546 Pain in thoracic spine: Secondary | ICD-10-CM | POA: Insufficient documentation
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
## 2021-05-29 ENCOUNTER — Encounter (HOSPITAL_COMMUNITY): Payer: 59 | Admitting: Physical Therapy

## 2021-05-30 ENCOUNTER — Ambulatory Visit (HOSPITAL_COMMUNITY): Payer: 59

## 2021-05-30 ENCOUNTER — Telehealth (HOSPITAL_COMMUNITY): Payer: Self-pay

## 2021-05-30 NOTE — Telephone Encounter (Signed)
No show, called with no answer and voicemailbox not active.     Becky Sax, LPTA/CLT; Rowe Clack [phone]

## 2021-06-03 ENCOUNTER — Encounter (HOSPITAL_COMMUNITY): Payer: Self-pay | Admitting: Physical Therapy

## 2021-06-03 ENCOUNTER — Ambulatory Visit (HOSPITAL_COMMUNITY): Payer: 59 | Admitting: Physical Therapy

## 2021-06-03 ENCOUNTER — Other Ambulatory Visit: Payer: Self-pay

## 2021-06-03 DIAGNOSIS — M545 Low back pain, unspecified: Secondary | ICD-10-CM

## 2021-06-03 DIAGNOSIS — M546 Pain in thoracic spine: Secondary | ICD-10-CM
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
## 2021-06-05 ENCOUNTER — Encounter (HOSPITAL_COMMUNITY): Payer: 59 | Admitting: Physical Therapy

## 2021-06-06 ENCOUNTER — Encounter (HOSPITAL_COMMUNITY): Payer: 59 | Admitting: Physical Therapy

## 2021-06-06 ENCOUNTER — Telehealth (HOSPITAL_COMMUNITY): Payer: Self-pay | Admitting: Physical Therapy

## 2021-06-06 NOTE — Telephone Encounter (Signed)
Pt second no show:   Called pt; he stated that he forgot about this appointment. Virgina Organ, PT CLT [phone]

## 2021-06-10 ENCOUNTER — Encounter (HOSPITAL_COMMUNITY): Payer: Self-pay

## 2021-06-10 ENCOUNTER — Encounter (HOSPITAL_COMMUNITY): Payer: 59 | Admitting: Physical Therapy

## 2021-06-10 ENCOUNTER — Ambulatory Visit (HOSPITAL_COMMUNITY): Payer: 59

## 2021-06-10 ENCOUNTER — Other Ambulatory Visit: Payer: Self-pay

## 2021-06-10 DIAGNOSIS — M546 Pain in thoracic spine: Secondary | ICD-10-CM | POA: Diagnosis not present

## 2021-06-10 DIAGNOSIS — M545 Low back pain, unspecified: Secondary | ICD-10-CM | POA: Diagnosis not present
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
## 2021-06-12 ENCOUNTER — Telehealth (HOSPITAL_COMMUNITY): Payer: Self-pay

## 2021-06-12 ENCOUNTER — Encounter (HOSPITAL_COMMUNITY): Payer: 59 | Admitting: Physical Therapy

## 2021-06-12 ENCOUNTER — Encounter (HOSPITAL_COMMUNITY): Payer: 59

## 2021-06-12 NOTE — Telephone Encounter (Signed)
No show, attempted to call with no answer or voicemail active.  Cancelled remaining apts except next apt due to no show policy.   Becky Sax, LPTA/CLT; [place] [phone]

## 2021-06-17 ENCOUNTER — Other Ambulatory Visit: Payer: Self-pay

## 2021-06-17 ENCOUNTER — Ambulatory Visit (HOSPITAL_COMMUNITY): Payer: 59 | Admitting: Physical Therapy

## 2021-06-17 ENCOUNTER — Encounter (HOSPITAL_COMMUNITY): Payer: Self-pay | Admitting: Physical Therapy

## 2021-06-17 DIAGNOSIS — M546 Pain in thoracic spine: Secondary | ICD-10-CM | POA: Diagnosis not present

## 2021-06-17 DIAGNOSIS — M545 Low back pain, unspecified: Secondary | ICD-10-CM | POA: Diagnosis not present
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
## 2021-06-19 ENCOUNTER — Other Ambulatory Visit: Payer: Self-pay

## 2021-06-19 ENCOUNTER — Ambulatory Visit (HOSPITAL_COMMUNITY): Payer: 59 | Admitting: Physical Therapy

## 2021-06-19 ENCOUNTER — Encounter (HOSPITAL_COMMUNITY): Payer: Self-pay | Admitting: Physical Therapy

## 2021-06-19 ENCOUNTER — Encounter (HOSPITAL_COMMUNITY): Payer: 59 | Admitting: Physical Therapy

## 2021-06-19 DIAGNOSIS — M546 Pain in thoracic spine: Secondary | ICD-10-CM

## 2021-06-19 DIAGNOSIS — M545 Low back pain, unspecified: Secondary | ICD-10-CM | POA: Diagnosis not present
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
## 2021-06-24 ENCOUNTER — Encounter (HOSPITAL_COMMUNITY): Payer: 59 | Admitting: Physical Therapy

## 2021-07-02 DIAGNOSIS — S22081A Stable burst fracture of T11-T12 vertebra, initial encounter for closed fracture: Secondary | ICD-10-CM | POA: Diagnosis not present

## 2022-04-13 IMAGING — DX DG TIBIA/FIBULA 2V*L*
4 series · 4 of 4 positions shown · non-contrast
Comparison: None.

CLINICAL DATA: Pain

EXAM:
LEFT TIBIA AND FIBULA - 2 VIEW

[tibia ap (1 of 2)]
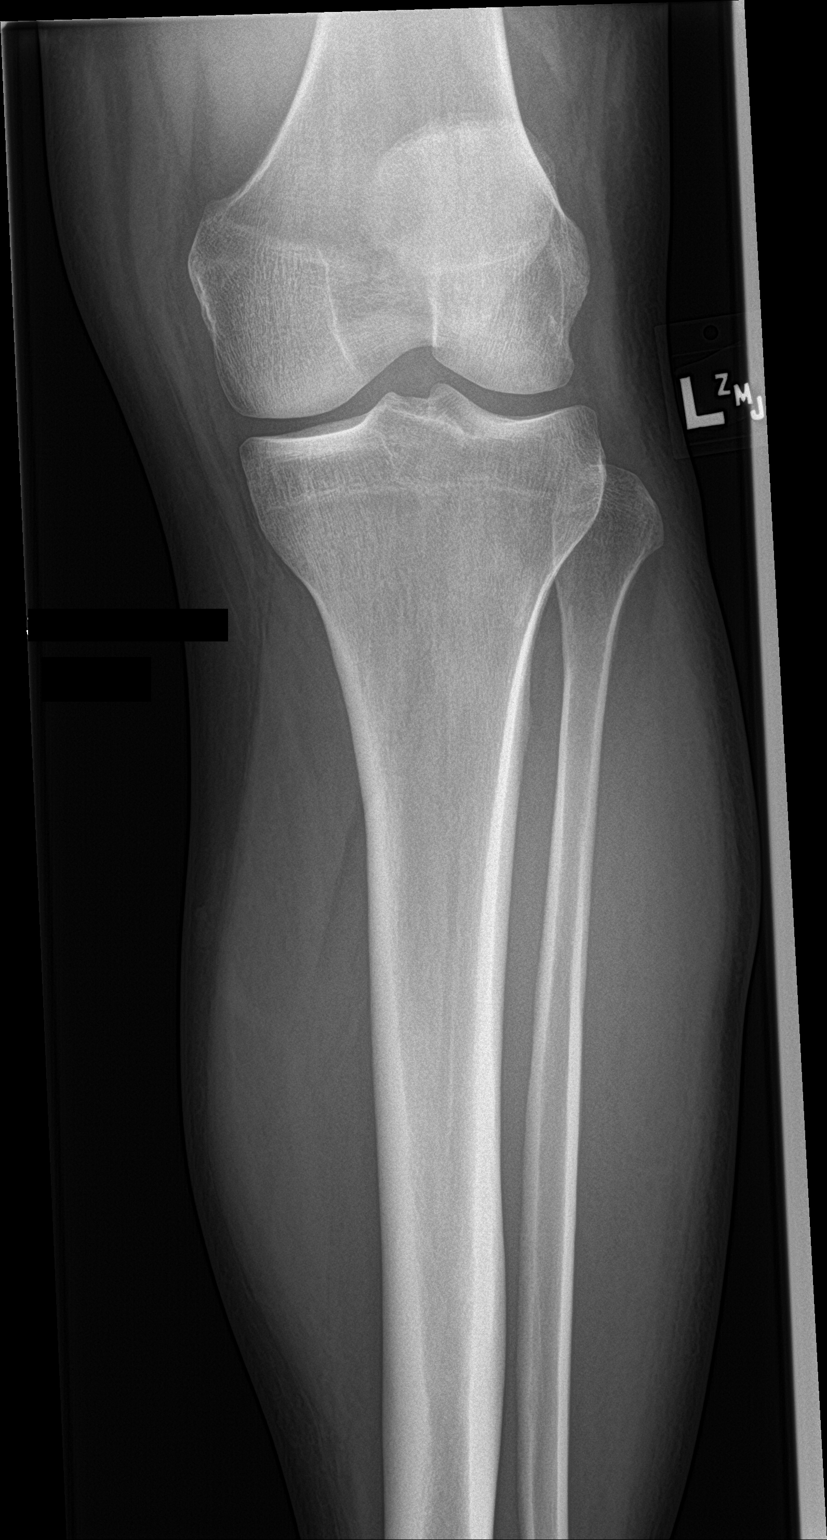

[tibia ap (2 of 2)]
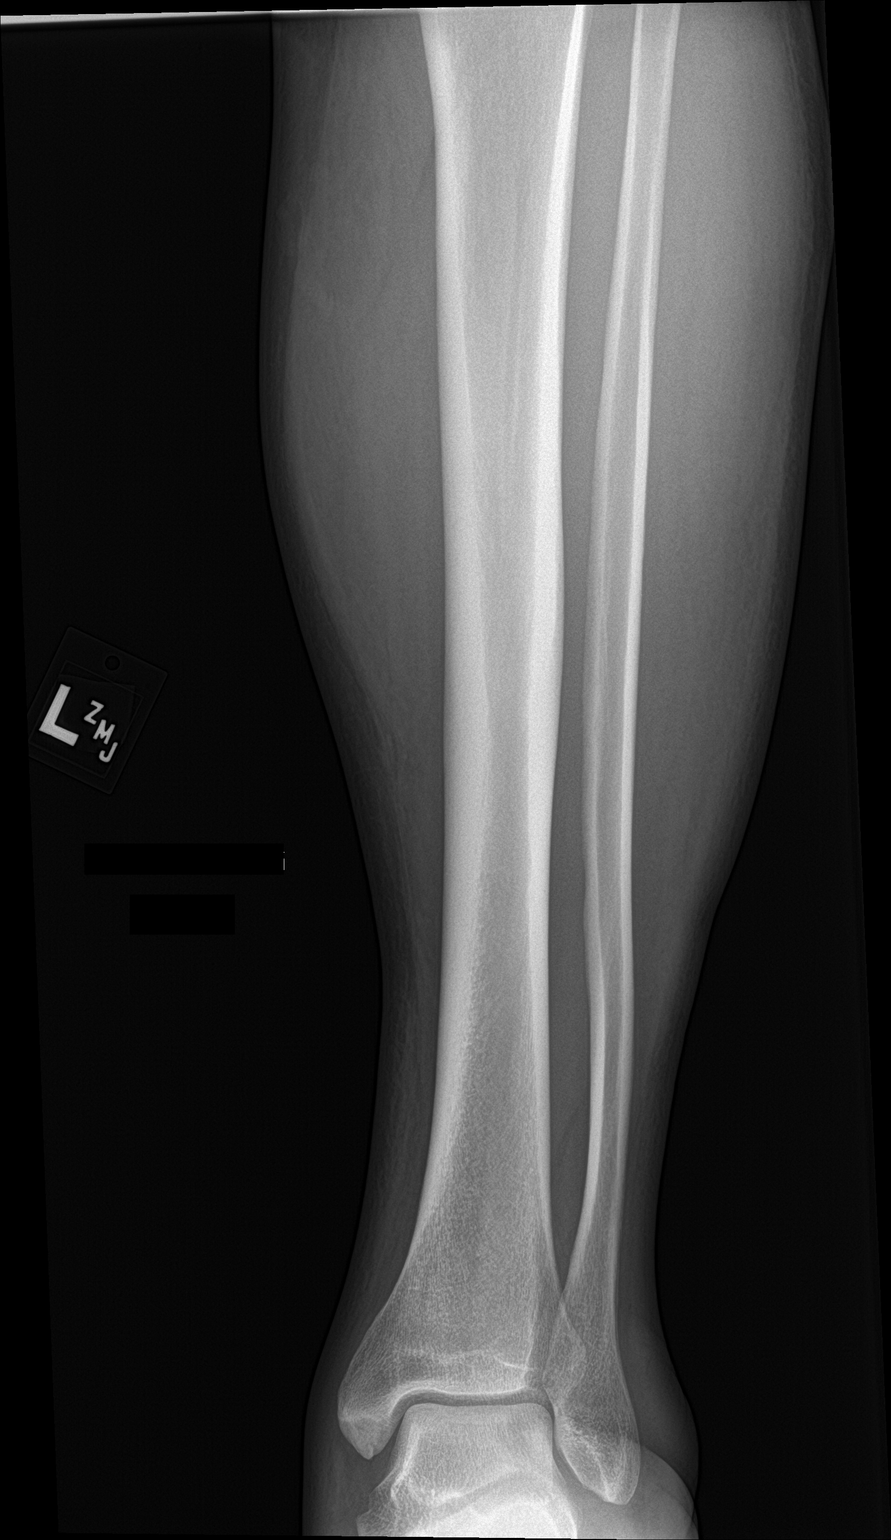

[tibia lat (1 of 2)]
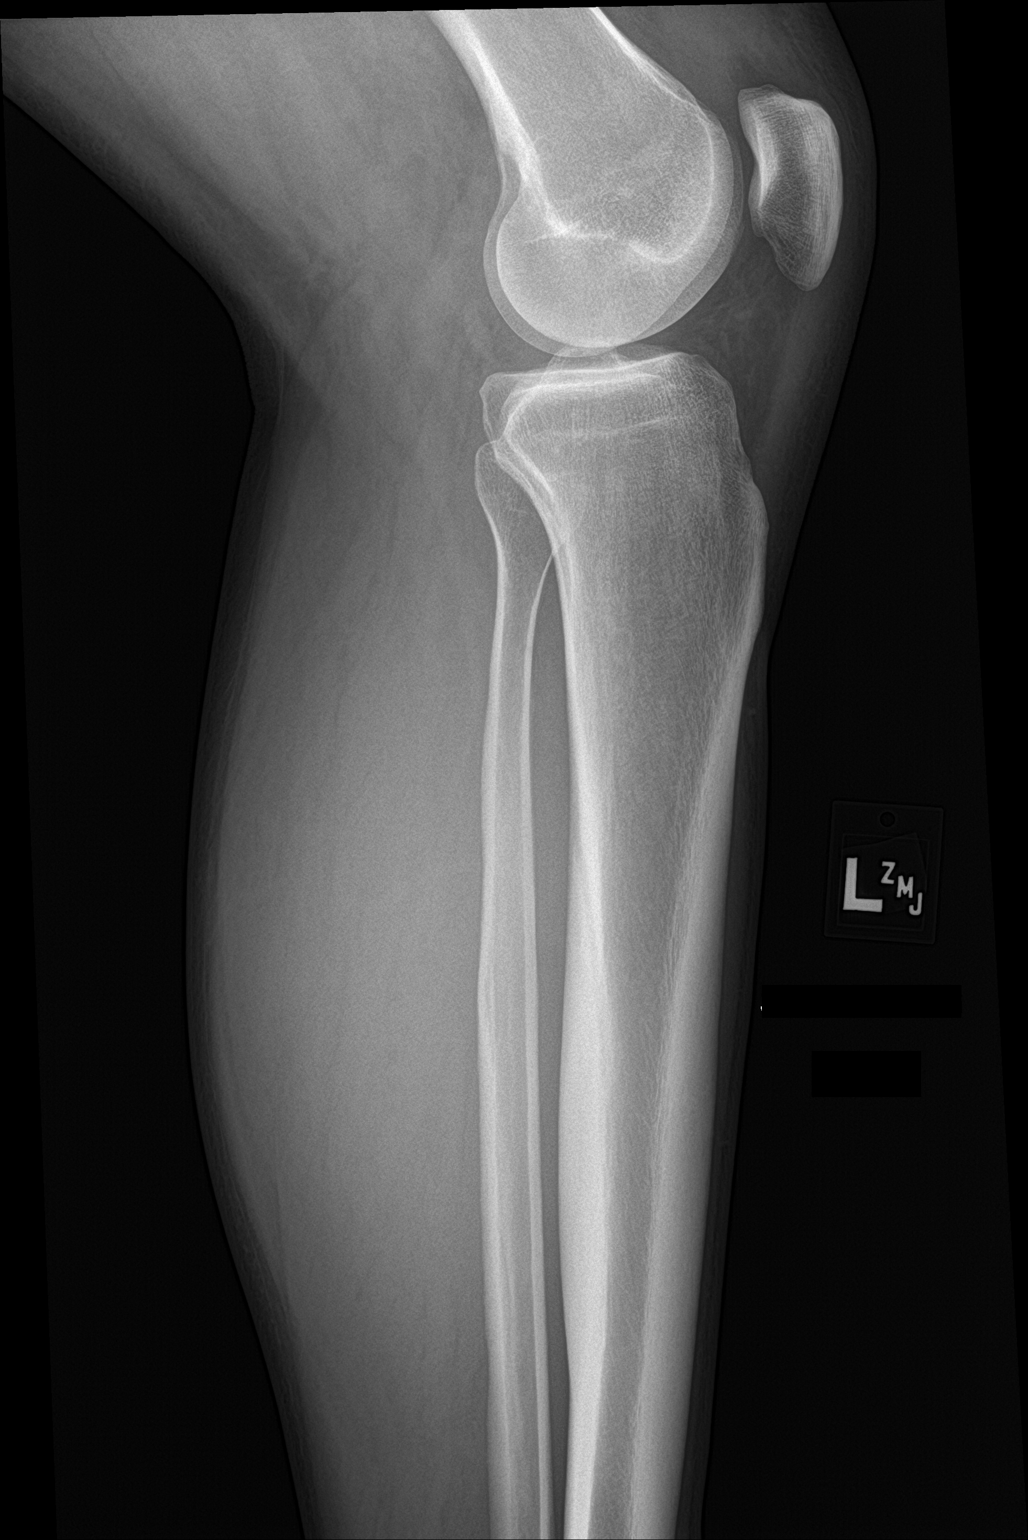

[tibia lat (2 of 2)]
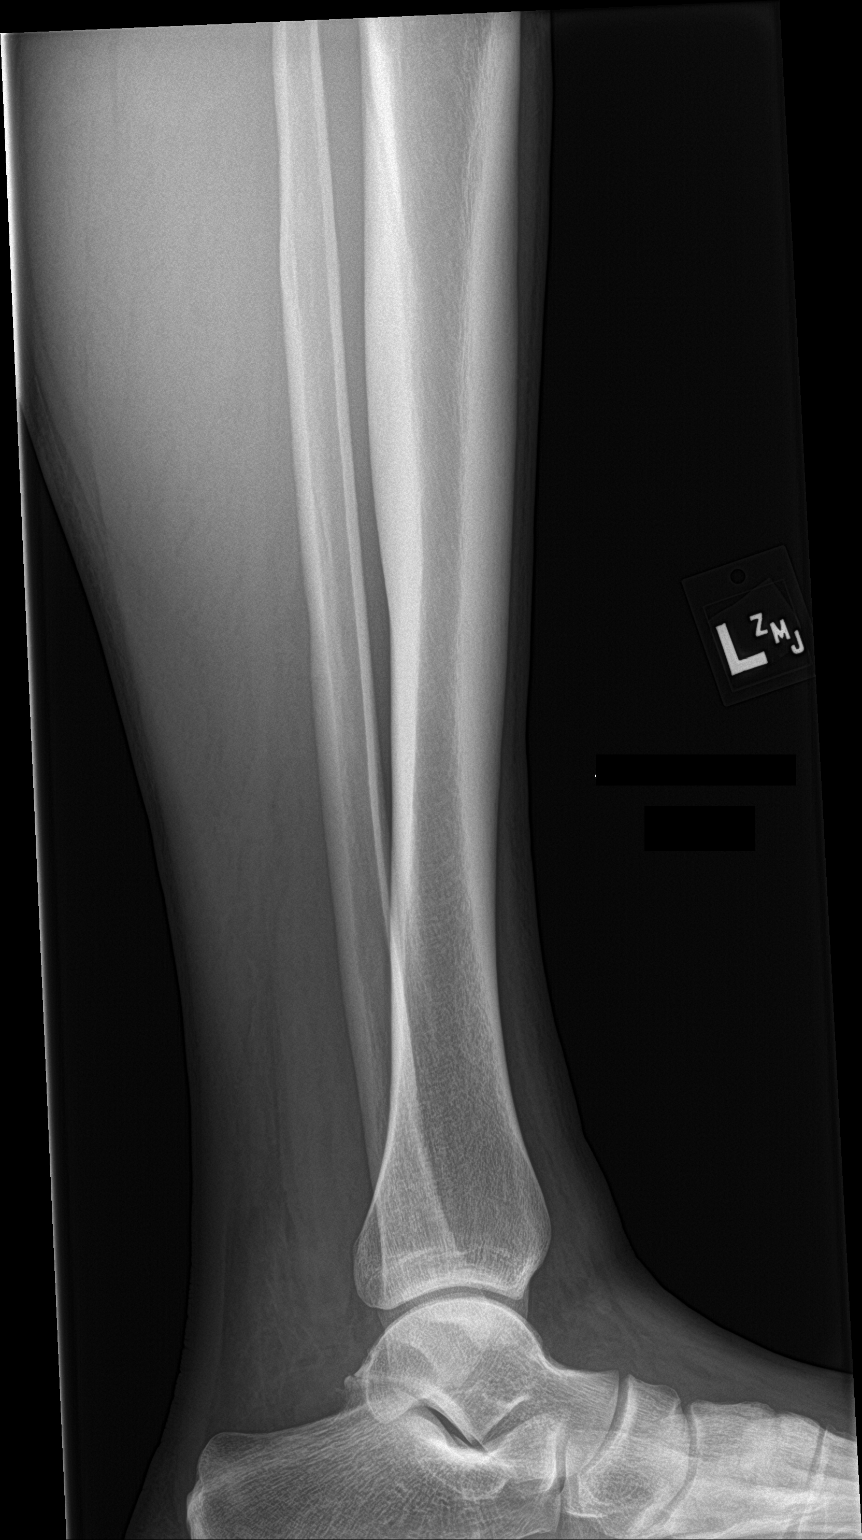

[4 of 4 positions shown; findings below may reference images not displayed]

FINDINGS: There is no acute osseous abnormality. No significant degenerative
change.
IMPRESSION: No acute osseous abnormality involving the left tibia or fibula.
